# Patient Record
Sex: Female | Born: 1945 | Race: White | Hispanic: No | Marital: Married | Smoking: Former smoker
Health system: Southern US, Community
[De-identification: ages and names within clinical notes are randomized; demographics above are authoritative.]

## PROBLEM LIST (undated history)

## (undated) DIAGNOSIS — T7840XA Allergy, unspecified, initial encounter: Secondary | ICD-10-CM

## (undated) DIAGNOSIS — I639 Cerebral infarction, unspecified: Secondary | ICD-10-CM

## (undated) DIAGNOSIS — I10 Essential (primary) hypertension: Secondary | ICD-10-CM

## (undated) DIAGNOSIS — F419 Anxiety disorder, unspecified: Secondary | ICD-10-CM

## (undated) DIAGNOSIS — M069 Rheumatoid arthritis, unspecified: Secondary | ICD-10-CM

## (undated) HISTORY — PX: TUBAL LIGATION: SHX77

## (undated) HISTORY — PX: BLADDER SURGERY: SHX569

## (undated) HISTORY — DX: Anxiety disorder, unspecified: F41.9

## (undated) HISTORY — PX: APPENDECTOMY: SHX54

## (undated) HISTORY — PX: CHOLECYSTECTOMY: SHX55

## (undated) HISTORY — DX: Cerebral infarction, unspecified: I63.9

## (undated) HISTORY — PX: VAGINAL HYSTERECTOMY: SUR661

## (undated) HISTORY — DX: Essential (primary) hypertension: I10

## (undated) HISTORY — PX: EYE SURGERY: SHX253

## (undated) HISTORY — DX: Allergy, unspecified, initial encounter: T78.40XA

## (undated) HISTORY — DX: Rheumatoid arthritis, unspecified: M06.9

---

## 1998-06-05 ENCOUNTER — Other Ambulatory Visit: Admission: RE | Admit: 1998-06-05 | Discharge: 1998-06-05 | Payer: Self-pay | Admitting: Obstetrics and Gynecology

## 1998-07-16 ENCOUNTER — Ambulatory Visit (HOSPITAL_COMMUNITY): Admission: RE | Admit: 1998-07-16 | Discharge: 1998-07-16 | Payer: Self-pay | Admitting: Obstetrics and Gynecology

## 1999-03-13 ENCOUNTER — Emergency Department (HOSPITAL_COMMUNITY): Admission: EM | Admit: 1999-03-13 | Discharge: 1999-03-13 | Payer: Self-pay | Admitting: Emergency Medicine

## 1999-04-15 DIAGNOSIS — K449 Diaphragmatic hernia without obstruction or gangrene: Secondary | ICD-10-CM | POA: Insufficient documentation

## 1999-04-29 ENCOUNTER — Encounter: Payer: Self-pay | Admitting: Gastroenterology

## 1999-06-14 ENCOUNTER — Other Ambulatory Visit: Admission: RE | Admit: 1999-06-14 | Discharge: 1999-06-14 | Payer: Self-pay | Admitting: Obstetrics and Gynecology

## 1999-08-12 ENCOUNTER — Encounter: Payer: Self-pay | Admitting: Obstetrics and Gynecology

## 1999-08-12 ENCOUNTER — Ambulatory Visit (HOSPITAL_COMMUNITY): Admission: RE | Admit: 1999-08-12 | Discharge: 1999-08-12 | Payer: Self-pay | Admitting: Obstetrics and Gynecology

## 2000-06-29 ENCOUNTER — Other Ambulatory Visit: Admission: RE | Admit: 2000-06-29 | Discharge: 2000-06-29 | Payer: Self-pay | Admitting: Obstetrics and Gynecology

## 2001-03-26 ENCOUNTER — Encounter: Payer: Self-pay | Admitting: Obstetrics and Gynecology

## 2001-03-26 ENCOUNTER — Ambulatory Visit (HOSPITAL_COMMUNITY): Admission: RE | Admit: 2001-03-26 | Discharge: 2001-03-26 | Payer: Self-pay | Admitting: Obstetrics and Gynecology

## 2001-08-23 ENCOUNTER — Other Ambulatory Visit: Admission: RE | Admit: 2001-08-23 | Discharge: 2001-08-23 | Payer: Self-pay | Admitting: Obstetrics and Gynecology

## 2002-11-16 ENCOUNTER — Encounter: Payer: Self-pay | Admitting: Obstetrics and Gynecology

## 2002-11-16 ENCOUNTER — Ambulatory Visit (HOSPITAL_COMMUNITY): Admission: RE | Admit: 2002-11-16 | Discharge: 2002-11-16 | Payer: Self-pay | Admitting: Obstetrics and Gynecology

## 2004-05-28 ENCOUNTER — Ambulatory Visit (HOSPITAL_COMMUNITY): Admission: RE | Admit: 2004-05-28 | Discharge: 2004-05-28 | Payer: Self-pay | Admitting: Obstetrics and Gynecology

## 2005-01-24 ENCOUNTER — Ambulatory Visit (HOSPITAL_COMMUNITY): Admission: RE | Admit: 2005-01-24 | Discharge: 2005-01-24 | Payer: Self-pay | Admitting: Chiropractic Medicine

## 2005-07-07 ENCOUNTER — Ambulatory Visit (HOSPITAL_COMMUNITY): Admission: RE | Admit: 2005-07-07 | Discharge: 2005-07-07 | Payer: Self-pay | Admitting: Obstetrics and Gynecology

## 2006-05-27 ENCOUNTER — Ambulatory Visit: Payer: Self-pay | Admitting: Gastroenterology

## 2006-08-13 ENCOUNTER — Ambulatory Visit (HOSPITAL_COMMUNITY): Admission: RE | Admit: 2006-08-13 | Discharge: 2006-08-13 | Payer: Self-pay | Admitting: Family Medicine

## 2007-07-01 ENCOUNTER — Other Ambulatory Visit: Admission: RE | Admit: 2007-07-01 | Discharge: 2007-07-01 | Payer: Self-pay | Admitting: Gynecology

## 2007-07-02 ENCOUNTER — Ambulatory Visit: Payer: Self-pay | Admitting: Gastroenterology

## 2007-07-15 ENCOUNTER — Ambulatory Visit: Payer: Self-pay | Admitting: Gastroenterology

## 2007-08-13 ENCOUNTER — Ambulatory Visit: Payer: Self-pay | Admitting: Gastroenterology

## 2007-08-18 ENCOUNTER — Ambulatory Visit: Payer: Self-pay | Admitting: Gastroenterology

## 2007-10-15 ENCOUNTER — Ambulatory Visit (HOSPITAL_COMMUNITY): Admission: RE | Admit: 2007-10-15 | Discharge: 2007-10-15 | Payer: Self-pay | Admitting: Family Medicine

## 2008-03-29 DIAGNOSIS — K5909 Other constipation: Secondary | ICD-10-CM

## 2008-03-29 DIAGNOSIS — A048 Other specified bacterial intestinal infections: Secondary | ICD-10-CM | POA: Insufficient documentation

## 2008-03-29 DIAGNOSIS — R609 Edema, unspecified: Secondary | ICD-10-CM

## 2008-03-29 DIAGNOSIS — I10 Essential (primary) hypertension: Secondary | ICD-10-CM | POA: Insufficient documentation

## 2008-03-29 DIAGNOSIS — K589 Irritable bowel syndrome without diarrhea: Secondary | ICD-10-CM

## 2008-03-29 DIAGNOSIS — K219 Gastro-esophageal reflux disease without esophagitis: Secondary | ICD-10-CM

## 2008-03-29 DIAGNOSIS — E039 Hypothyroidism, unspecified: Secondary | ICD-10-CM | POA: Insufficient documentation

## 2008-05-15 ENCOUNTER — Emergency Department (HOSPITAL_COMMUNITY): Admission: EM | Admit: 2008-05-15 | Discharge: 2008-05-15 | Payer: Self-pay | Admitting: Emergency Medicine

## 2008-05-15 ENCOUNTER — Telehealth: Payer: Self-pay | Admitting: Gastroenterology

## 2008-09-26 ENCOUNTER — Ambulatory Visit: Payer: Self-pay | Admitting: Gastroenterology

## 2008-09-26 DIAGNOSIS — K3189 Other diseases of stomach and duodenum: Secondary | ICD-10-CM

## 2008-09-26 DIAGNOSIS — R05 Cough: Secondary | ICD-10-CM

## 2008-09-26 DIAGNOSIS — R1011 Right upper quadrant pain: Secondary | ICD-10-CM

## 2008-09-26 DIAGNOSIS — N3010 Interstitial cystitis (chronic) without hematuria: Secondary | ICD-10-CM | POA: Insufficient documentation

## 2008-09-26 DIAGNOSIS — R1013 Epigastric pain: Secondary | ICD-10-CM

## 2008-09-26 LAB — CONVERTED CEMR LAB
Bilirubin, Direct: 0.1 mg/dL (ref 0.0–0.3)
Total Bilirubin: 0.8 mg/dL (ref 0.3–1.2)
Total Protein: 7.3 g/dL (ref 6.0–8.3)

## 2008-10-05 ENCOUNTER — Ambulatory Visit (HOSPITAL_COMMUNITY): Admission: RE | Admit: 2008-10-05 | Discharge: 2008-10-05 | Payer: Self-pay | Admitting: Gastroenterology

## 2008-10-09 DIAGNOSIS — K8020 Calculus of gallbladder without cholecystitis without obstruction: Secondary | ICD-10-CM | POA: Insufficient documentation

## 2008-10-12 ENCOUNTER — Telehealth: Payer: Self-pay | Admitting: Gastroenterology

## 2008-10-17 ENCOUNTER — Telehealth: Payer: Self-pay | Admitting: Gastroenterology

## 2008-11-02 ENCOUNTER — Encounter: Payer: Self-pay | Admitting: Gastroenterology

## 2008-11-07 ENCOUNTER — Ambulatory Visit (HOSPITAL_COMMUNITY): Admission: RE | Admit: 2008-11-07 | Discharge: 2008-11-07 | Payer: Self-pay | Admitting: Surgery

## 2008-11-07 ENCOUNTER — Encounter (INDEPENDENT_AMBULATORY_CARE_PROVIDER_SITE_OTHER): Payer: Self-pay | Admitting: Surgery

## 2008-11-13 ENCOUNTER — Telehealth: Payer: Self-pay | Admitting: Gastroenterology

## 2008-12-05 ENCOUNTER — Encounter: Payer: Self-pay | Admitting: Gastroenterology

## 2008-12-18 ENCOUNTER — Telehealth: Payer: Self-pay | Admitting: Gastroenterology

## 2009-02-06 ENCOUNTER — Telehealth: Payer: Self-pay | Admitting: Gastroenterology

## 2009-04-12 ENCOUNTER — Telehealth: Payer: Self-pay | Admitting: Gastroenterology

## 2009-05-09 ENCOUNTER — Encounter: Payer: Self-pay | Admitting: Gastroenterology

## 2009-09-13 ENCOUNTER — Telehealth: Payer: Self-pay | Admitting: Gastroenterology

## 2009-10-12 ENCOUNTER — Encounter: Payer: Self-pay | Admitting: Gastroenterology

## 2010-01-02 ENCOUNTER — Telehealth (INDEPENDENT_AMBULATORY_CARE_PROVIDER_SITE_OTHER): Payer: Self-pay | Admitting: *Deleted

## 2010-07-17 ENCOUNTER — Telehealth: Payer: Self-pay | Admitting: Gastroenterology

## 2010-08-13 ENCOUNTER — Telehealth: Payer: Self-pay | Admitting: Gastroenterology

## 2010-09-20 ENCOUNTER — Ambulatory Visit (HOSPITAL_COMMUNITY): Admission: RE | Admit: 2010-09-20 | Discharge: 2010-09-20 | Payer: Self-pay | Admitting: Internal Medicine

## 2010-10-02 ENCOUNTER — Encounter: Admission: RE | Admit: 2010-10-02 | Discharge: 2010-10-02 | Payer: Self-pay | Admitting: Internal Medicine

## 2011-01-16 NOTE — Progress Notes (Signed)
Summary: Aciphex samples  Phone Note Call from Patient Call back at Home Phone [phone]   Caller: Patient Call For: Dr. Jarold Motto Reason for Call: Talk to Nurse Summary of Call: Wants to know if we have any samples of Aciphex Initial call taken by: Karna Christmas,  July 17, 2010 3:10 PM  Follow-up for Phone Call        Lm for pt to call.  Samples avaialble.   Lupita Leash Surface RN  July 17, 2010 3:17 PM  LM that samples are available and may be picked up at front desk. Follow-up by: Ashok Cordia RN,  July 18, 2010 8:59 AM    New/Updated Medications: ACIPHEX 20 MG  TBEC (RABEPRAZOLE SODIUM) Take 1 each day 30 minutes before meals

## 2011-01-16 NOTE — Progress Notes (Signed)
Summary: Aciphex samples  Phone Note Call from Patient Call back at Home Phone [phone]   Call For: Dr Jarold Motto Summary of Call: Wonders if she can have more aciphex Initial call taken by: Leanor Kail [facility],  August 13, 2010 10:11 AM  Follow-up for Phone Call        Samples at front desk.  Lm for pt that they can be picked up any time. Follow-up by: Ashok Cordia RN,  August 14, 2010 9:47 AM    New/Updated Medications: ACIPHEX 20 MG  TBEC (RABEPRAZOLE SODIUM) Take 1 each day 30 minutes before meals

## 2011-01-16 NOTE — Progress Notes (Signed)
Summary: Record request   Request for records received from [facility]. Request forwarded to [facility]. Nichole Cruz  January 02, 2010 1:58 PM

## 2011-04-29 NOTE — Op Note (Signed)
NAMESHONTERIA, ABELN NO.:  [id number]   MEDICAL RECORD NO.:  [id number]          PATIENT TYPE:  AMB   LOCATION:  SDS                          FACILITY:  [facility]   PHYSICIAN:  Thomas A. Cornett, M.D.DATE OF BIRTH:  [birth year]   DATE OF PROCEDURE:  11/07/2008  DATE OF DISCHARGE:  11/07/2008                               OPERATIVE REPORT   PREOPERATIVE DIAGNOSIS:  Biliary dyskinesia.   POSTOPERATIVE DIAGNOSIS:  Biliary dyskinesia.   PROCEDURE:  Laparoscopic cholecystectomy with intraoperative  cholangiogram using fluoroscopy.   SURGEON:  Maisie Fus A. Cornett, MD   ASSISTANT:  Ollen Gross. Vernell Morgans, MD   ANESTHESIA:  General endotracheal anesthesia, 0.25% Sensorcaine local.   ESTIMATED BLOOD LOSS:  20 mL.   SPECIMEN:  Gallbladder to pathology.   INDICATIONS FOR PROCEDURE:  The patient is a 65 year old female with  abdominal pain and a low ejection fraction on HIDA study.  She presents  for laparoscopic cholecystectomy for that.  We discussed the potential  outcomes given the ejection fraction of 44% which she had.  There was  roughly a 50:50 chance of this helping her which I discussed in our  office.  Given her amount of pain, she did want to proceed with this  after I discussed the options of surgery versus nonoperative management  of this.   DESCRIPTION OF PROCEDURE:  The patient was brought to the operating room  and placed supine.  After induction of general anesthesia, the abdomen  was prepped and draped in sterile fashion.  A 1-cm supraumbilical  incision was made.  Dissection was carried down to the fascia.  There  was a small umbilical hernia and I entered that and into the abdominal  cavity through that very easily.  Pursestring suture was placed around  this.  A 12-mm Hasson cannula was placed under direct vision through  this and pneumoperitoneum was created to 15 mmHg of CO2 with  laparoscope.  The patient was placed in reverse Trendelenburg and  rolled  towards her left.  Laparoscopy revealed no significant major intra-  abdominal abnormality.  Gallbladder did have some scarring to it  consistent with chronic cholecystitis.  Next, an 11-mm subxiphoid port  was placed and two 5-mm ports were placed in the right upper quadrant  under direct vision.  Gallbladder was grabbed by its dome and pushed  towards the patient's right shoulder.  A second grasper was used to grab  the gallbladder by the infundibulum and pulled to the patient's right  lower quadrant.  We used the dissection to dissect away the fat  overlying the gallbladder and identified the cystic duct as the only  tubular structure entering the gallbladder.  Clip was placed on  gallbladder side of this.  A small incision was made in the cystic duct  and through a separate stab incision, a Cook cholangiogram catheter was  introduced and placed in the cystic duct.  Intraoperative cholangiogram  was then performed which showed free flow of contrast down the cystic  duct into the common bile duct.  Free flow of contrast from the common  hepatic duct into right and left hepatic ductules.  No sign of  stricture, stone, or extravasation.  Catheter was then removed.  The  cystic duct stump was triple clipped and divided.  Cystic artery was  also divided between clips.  The individual branches that came off the  main trunk were taken on the gallbladder body and divided.  Cautery was  then used to dissect the gallbladder from the gallbladder fossa without  difficulty.  Gallbladder was placed in an EndoCatch bag.  Gallbladder  bed was examined, found to be hemostatic without bleeding or leakage of  bile.  At this point in time, we extracted the gallbladder through the  umbilical port by placing the camera in the subxiphoid port.  We then  flattened the patient out, suctioned out any excess irrigation, found  the gallbladder bed to be hemostatic without any leakage of bile.  No   evidence of injury to the small bowel, colon, or other intra-abdominal  organs upon inspection at this point.  We then allowed the CO2 to escape  after closing the umbilical port site with a pursestring suture of 0  Vicryl once we removed the gallbladder.  Once CO2 was allowed to escape,  we closed all skin incisions with 4-0 Monocryl.  Dermabond was applied.  All final counts of sponge, needle, and instruments were found to be  correct at this portion of the case.  The patient was then awoke and  taken to recovery in satisfactory condition.      Thomas A. Cornett, M.D.  Electronically Signed     TAC/MEDQ  D:  11/07/2008  T:  11/08/2008  Job:  [id number]   cc:   Vania Rea. Jarold Motto, MD, Clementeen Graham, FACP, FAGA  Aida Puffer

## 2011-04-29 NOTE — Assessment & Plan Note (Signed)
[facility]                         GASTROENTEROLOGY OFFICE NOTE   NAME:HERNDONLavaughn, Bisig                  MRN:          [id number]  DATE:07/02/2007                            DOB:          June 01, 1946    Yuriko continues with some spasmodic type pain in the subxiphoid area,  especially under periods of stress.  She has had known acid reflux but  has responded well to omeprazole 20 mg a day.  She denies hepatobiliary  complaints but has not had an ultrasound exam in close to 10 years.  Last endoscopy performed in May 2000, did show a 5-cm hiatal hernia.  Biopsy at that time for Helicobacter was negative.  She did have  esophageal stricture that was dilated at that time.   She apparently has been treated twice the last 2 years for H. pylori  infection.  I suspect she has a chronically positive serologic antibody  but does not have active H. pylori disease.  In any case, antibiotic  therapy has not changed any of her symptomatology.  It  described as an  intermittent dull aching discomfort in her epigastric area, best  relieved by using Librax.  She has had a thorough GI workup in the past  and has a long history of irritable bowel syndrome for at least the last  20 years.   The patient does have a family history of colon carcinoma and actually  is due for colonoscopy followup.   PHYSICAL EXAMINATION:  VITAL SIGNS:  Show her weight is 202 pounds,  blood pressure 128/82, pulse 80 and regular.  I could not appreciate  stigmata of chronic liver disease.  CHEST:  Clear  HEART:  She was in a regular rhythm without murmurs, gallops, or rubs.  ABDOMEN:  I could not appreciate hepatosplenomegaly, abdominal masses,  or tenderness.  Bowel sounds were normal.   ASSESSMENT:  1. 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  2. Rule out cholelithiasis.  3. Need for followup colonoscopy for a positive family history of      colon  cancer.  4. Chronic irritable bowel syndrome.   RECOMMENDATIONS:  1. Outpatient ultrasound exam.  2. I will change her from omeprazole to 40 mg of Nexium daily.  See if      this helps her complaints.  3. P.r.n. Librax use.  4. Schedule outpatient colonoscopy followup.     Vania Rea. Jarold Motto, MD, Caleen Essex, FAGA  Electronically Signed    DRP/MedQ  DD: 07/02/2007  DT: 07/02/2007  Job #: [id number]   cc:   Chari Manning, Dr.

## 2011-09-01 ENCOUNTER — Other Ambulatory Visit (HOSPITAL_COMMUNITY): Payer: Self-pay | Admitting: Internal Medicine

## 2011-09-01 DIAGNOSIS — Z1231 Encounter for screening mammogram for malignant neoplasm of breast: Secondary | ICD-10-CM

## 2011-09-11 LAB — BASIC METABOLIC PANEL
CO2: 24
Chloride: 107
GFR calc Af Amer: >60
Potassium: 3.9
Sodium: 140

## 2011-09-11 LAB — CBC
HCT: 36.3
Hemoglobin: 12.2
MCHC: 33.5
MCV: 86.8
RBC: 4.18

## 2011-09-11 LAB — DIFFERENTIAL
Basophils Relative: 0
Eosinophils Absolute: 0.2
Eosinophils Relative: 2
Monocytes Absolute: 0.7
Monocytes Relative: 8
Neutro Abs: 4.4

## 2011-09-11 LAB — POCT CARDIAC MARKERS: CKMB, poc: <1 — ABNORMAL LOW

## 2011-09-16 LAB — DIFFERENTIAL
Basophils Absolute: 0.1
Eosinophils Absolute: 0.4
Monocytes Absolute: 0.4
Neutrophils Relative %: 57

## 2011-09-16 LAB — COMPREHENSIVE METABOLIC PANEL
ALT: 17
Alkaline Phosphatase: 38 — ABNORMAL LOW
CO2: 24
Calcium: 9
GFR calc non Af Amer: >60
Glucose, Bld: 136 — ABNORMAL HIGH
Potassium: 4
Sodium: 137

## 2011-09-16 LAB — CBC
Hemoglobin: 12.4
MCHC: 33.9
RBC: 4.25

## 2011-09-23 ENCOUNTER — Ambulatory Visit (HOSPITAL_COMMUNITY): Payer: Self-pay

## 2011-10-01 ENCOUNTER — Ambulatory Visit (HOSPITAL_COMMUNITY)
Admission: RE | Admit: 2011-10-01 | Discharge: 2011-10-01 | Disposition: A | Discharge status: Home / Self Care | Payer: Medicare Other | Source: Ambulatory Visit | Attending: Internal Medicine | Admitting: Internal Medicine

## 2011-10-01 DIAGNOSIS — Z1231 Encounter for screening mammogram for malignant neoplasm of breast: Secondary | ICD-10-CM | POA: Insufficient documentation

## 2012-02-13 ENCOUNTER — Other Ambulatory Visit: Payer: Self-pay | Admitting: Internal Medicine

## 2012-02-13 DIAGNOSIS — M5412 Radiculopathy, cervical region: Secondary | ICD-10-CM

## 2012-02-18 ENCOUNTER — Ambulatory Visit
Admission: RE | Admit: 2012-02-18 | Discharge: 2012-02-18 | Disposition: A | Discharge status: Home / Self Care | Payer: Medicare Other | Source: Ambulatory Visit | Attending: Internal Medicine | Admitting: Internal Medicine

## 2012-02-18 DIAGNOSIS — M5412 Radiculopathy, cervical region: Secondary | ICD-10-CM

## 2012-10-07 ENCOUNTER — Other Ambulatory Visit (HOSPITAL_COMMUNITY): Payer: Self-pay | Admitting: Internal Medicine

## 2012-10-07 DIAGNOSIS — Z1231 Encounter for screening mammogram for malignant neoplasm of breast: Secondary | ICD-10-CM

## 2012-10-25 ENCOUNTER — Ambulatory Visit (HOSPITAL_COMMUNITY)
Admission: RE | Admit: 2012-10-25 | Discharge: 2012-10-25 | Disposition: A | Discharge status: Home / Self Care | Payer: Medicare Other | Source: Ambulatory Visit | Attending: Internal Medicine | Admitting: Internal Medicine

## 2012-10-25 DIAGNOSIS — Z1231 Encounter for screening mammogram for malignant neoplasm of breast: Secondary | ICD-10-CM

## 2013-09-28 ENCOUNTER — Other Ambulatory Visit (HOSPITAL_COMMUNITY): Payer: Self-pay | Admitting: Internal Medicine

## 2013-09-28 DIAGNOSIS — Z1231 Encounter for screening mammogram for malignant neoplasm of breast: Secondary | ICD-10-CM

## 2013-11-01 ENCOUNTER — Other Ambulatory Visit (HOSPITAL_COMMUNITY): Payer: Self-pay | Admitting: Internal Medicine

## 2013-11-01 ENCOUNTER — Ambulatory Visit (HOSPITAL_COMMUNITY)
Admission: RE | Admit: 2013-11-01 | Discharge: 2013-11-01 | Disposition: A | Discharge status: Home / Self Care | Payer: Medicare Other | Source: Ambulatory Visit | Attending: Internal Medicine | Admitting: Internal Medicine

## 2013-11-01 DIAGNOSIS — Z1231 Encounter for screening mammogram for malignant neoplasm of breast: Secondary | ICD-10-CM | POA: Insufficient documentation

## 2014-10-23 ENCOUNTER — Other Ambulatory Visit: Payer: Self-pay | Admitting: Internal Medicine

## 2014-10-23 DIAGNOSIS — Z139 Encounter for screening, unspecified: Secondary | ICD-10-CM

## 2014-11-01 ENCOUNTER — Ambulatory Visit: Payer: Medicare Other

## 2014-11-02 ENCOUNTER — Ambulatory Visit: Payer: Medicare Other

## 2015-03-27 ENCOUNTER — Ambulatory Visit (INDEPENDENT_AMBULATORY_CARE_PROVIDER_SITE_OTHER): Payer: Medicare Other | Admitting: Physical Therapy

## 2015-03-27 ENCOUNTER — Encounter: Payer: Self-pay | Admitting: Physical Therapy

## 2015-03-27 DIAGNOSIS — M542 Cervicalgia: Secondary | ICD-10-CM

## 2015-03-27 DIAGNOSIS — R293 Abnormal posture: Secondary | ICD-10-CM | POA: Diagnosis not present

## 2015-03-27 DIAGNOSIS — G8929 Other chronic pain: Secondary | ICD-10-CM

## 2015-03-27 NOTE — Patient Instructions (Signed)
  Flexibility: Upper Trapezius Stretch   Gently grasp right side of head while reaching behind back with other hand. Tilt head away until a gentle stretch is felt. Hold _30___ seconds. Repeat 3___ times per set. Do ____ sets per session. Do _2___ sessions per day.  http://orth.exer.us/340   Levator Stretch   Grasp seat or sit on hand on side to be stretched. Turn head toward other side and look down. Use hand on head to gently stretch neck in that position. Hold _30___ seconds. Repeat on other side. Repeat _3___ times. Do 2____ sessions per day.  http://gt2.exer.us/30   Scapular Retraction (Standing) and SHOULDER ROLLS backwards   With arms at sides, pinch shoulder blades together. Repeat _10___ times per set. Do ____ sets per session. Do __2__ sessions per day.  http://orth.exer.us/944    Posture - Sitting   Sit upright, head facing forward. Try using a roll to support lower back. Keep shoulders relaxed, and avoid rounded back. Keep hips level with knees. Avoid crossing legs for long periods.   Flexibility: Corner Stretch   Standing in corner or in the doorway with hands just above shoulder level lean forward until a comfortable stretch is felt across chest. Hold _30___ seconds. Repeat __3__ times per set. Do ____ sets per session. Do __2__ sessions per day.  http://orth.exer.us/342   Copyright  VHI. All rights reserved.  Solon PalmJulie Torre [place]umb[place], PT 03/27/2015 9:19 AM  [facility] [phone]

## 2015-03-27 NOTE — Therapy (Addendum)
[place], [place], [address] Phone: [phone]   Fax:  [phone]  Physical Therapy Evaluation  Patient Details  Name: Nichole Cruz MRN: [id number] Date of Birth: Aug 25, 1946 Referring Provider:  Melina Schools, MD  Encounter Date: 03/27/2015      PT End of Session - 03/27/15 0931    Visit Number 1   Number of Visits 8   Date for PT Re-Evaluation 04/27/15   PT Start Time 7035   PT Stop Time 0932   PT Time Calculation (min) 45 min   Activity Tolerance Patient tolerated treatment well   Behavior During Therapy [facility] for tasks assessed/performed      Past Medical History  Diagnosis Date  . Stroke     march 2015  . Allergy   . Hypertension   . Anxiety     Past Surgical History  Procedure Laterality Date  . Appendectomy    . Cesarean section    . Eye surgery    . Abdominal hysterectomy      There were no vitals filed for this visit.  Visit Diagnosis:  Posture abnormality - Plan: PT plan of care cert/re-cert  Chronic neck pain - Plan: PT plan of care cert/re-cert      Subjective Assessment - 03/27/15 0857    Subjective Patient has been experiencing this neck pain for 2-3 years and only comes on with stress. Patient cares for her husband with late stage dimentia. Doesnt hurt to pick 18 year old granddaughter.   Diagnostic tests xrays show some arthritis   Patient Stated Goals learn how to handle the neck pain   Currently in Pain? Yes   Pain Score 3    Pain Location Neck   Pain Orientation Left   Pain Descriptors / Indicators Tingling   Pain Type Acute pain   Pain Radiating Towards left shoulder to neck   Pain Onset More than a month ago   Pain Frequency Intermittent   Aggravating Factors  stress   Pain Relieving Factors leaving the situation            [facility] PT Assessment - 03/27/15 0001    Assessment   Medical Diagnosis cervical pain   Onset Date 02/12/14   Next MD  Visit no appt   Prior Therapy none   Precautions   Precautions None   Balance Screen   Has the patient fallen in the past 6 months No   Has the patient had a decrease in activity level because of a fear of falling?  No   Is the patient reluctant to leave their home because of a fear of falling?  No   Prior Function   Level of Independence Independent with basic ADLs   Observation/Other Assessments   Focus on Therapeutic Outcomes (FOTO)  18%   Posture/Postural Control   Postural Limitations Rounded Shoulders;Forward head;Increased thoracic kyphosis   Posture Comments Pt needed cues to keep cerv spine neutral during eval   Tone   Assessment Location Other (comment)   Tone Assessment - Other   Other Tone Location neck   Other Tone Location Comments increased tone in bil upper traps left > rt   ROM / Strength   AROM / PROM / Strength AROM;Strength   AROM   Overall AROM  --  cervical WNL; mild rt lat flex deficits due to  UT tightness   Strength   Overall Strength --  5/5 cervical strength no pain  Palpation   Palpation unremarkable                   [facility] Adult PT Treatment/Exercise - 03/27/15 0001    Posture/Postural Control   Posture/Postural Control Postural limitations  Education on correct sititng posture.   Exercises   Exercises Neck;Shoulder   Neck Exercises: Theraband   Scapula Retraction 10 reps  no band   Neck Exercises: Seated   Shoulder Rolls Backwards;5 reps   Modalities   Modalities Ultrasound   Ultrasound   Ultrasound Location left upper traps   Ultrasound Parameters 1.5 wcm2 1 mz cont x 8 min   Ultrasound Goals Other (Comment)  tone   Neck Exercises: Stretches   Upper Trapezius Stretch 2 reps;30 seconds  bil   Levator Stretch 2 reps;30 seconds   Corner Stretch 1 rep;30 seconds;Other (comment)  demonstrated and pt returned demo of doorway stretch as alt                PT Education - 03/27/15 0946    Education provided Yes    Education Details HEP   Person(s) Educated Patient   Methods Explanation;Demonstration;Handout   Comprehension Verbalized understanding;Returned demonstration;Tactile cues required          PT Short Term Goals - 03/27/15 0953    PT SHORT TERM GOAL #1   Title I with inital HEP   Time 2   Period Weeks   Status New           PT Long Term Goals - 03/27/15 0953    PT LONG TERM GOAL #1   Title I with advanced HEP   Time 4   Period Weeks   Status New   PT LONG TERM GOAL #2   Title report decreased incidence of neck pain by 60% or more.   Time 4   Period Weeks   Status New   PT LONG TERM GOAL #3   Title decreased pain in neck to 1/10 or less with stress   Time 4   Period Weeks   Status New   PT LONG TERM GOAL #4   Title no increase in FOTO limitation score   Time 4   Period Weeks   Status New               Plan - 03/27/15 0947    Clinical Impression Statement Patient presents with bil neck pain left > right that only presents when patient is under stress. Patient cares for her husband with dementia and feels relief whenever she is able to leave him for a while. Patient was dx with a stroke last year on left side, but no residual  effects. Patient demos postural deficits including forward head and rounded shoulders.   Pt will benefit from skilled therapeutic intervention in order to improve on the following deficits Impaired flexibility;Postural dysfunction;Pain;Decreased strength   Rehab Potential Good   PT Frequency 2x / week   PT Duration 4 weeks   PT Treatment/Interventions ADLs/Self Care Home Management;Moist Heat;Patient/family education;Therapeutic exercise;Ultrasound;Manual techniques;Cryotherapy;Neuromuscular re-education;Electrical Stimulation   PT Next Visit Plan review HEP, Meeks decompresion exercises, scapular strengthening, modalities prn   Consulted and Agree with Plan of Care Patient         Problem List Patient Active Problem List    Diagnosis Date Noted  . CHOLELITHIASIS 10/09/2008  . DYSPEPSIA 09/26/2008  . 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 COUGH 09/26/2008  . RUQ PAIN 09/26/2008  . ABDOMINAL PAIN-EPIGASTRIC 09/26/2008  . HELICOBACTER  PYLORI GASTRITIS 03/29/2008  . HYPOTHYROIDISM 03/29/2008  . HYPERTENSION 03/29/2008  . GERD 03/29/2008  . CONSTIPATION, CHRONIC 03/29/2008  . IRRITABLE BOWEL SYNDROME 03/29/2008  . PERIPHERAL EDEMA 03/29/2008  . HIATAL HERNIA 04/15/1999    Madelyn Flavors PT  03/27/2015, 10:08 AM  [facility] [place], [place], [address] Phone: [phone]   Fax:  [phone]    PHYSICAL THERAPY DISCHARGE SUMMARY  Visits from Start of Care: 1  Current functional level related to goals / functional outcomes: unknown   Remaining deficits: unknown   Education / Equipment: Initial HEP Plan:                                                    Patient goals were not met. Patient is being discharged due to not returning since the last visit.  ?????    Jeral Pinch, PT

## 2015-03-29 ENCOUNTER — Encounter: Payer: Medicare Other | Admitting: Physical Therapy

## 2015-04-02 ENCOUNTER — Encounter: Payer: Medicare Other | Admitting: Physical Therapy

## 2015-04-04 ENCOUNTER — Encounter: Payer: Medicare Other | Admitting: Physical Therapy

## 2015-04-09 ENCOUNTER — Encounter: Payer: Medicare Other | Admitting: Physical Therapy

## 2015-04-11 ENCOUNTER — Encounter: Payer: Medicare Other | Admitting: Physical Therapy

## 2015-04-16 ENCOUNTER — Encounter: Payer: Medicare Other | Admitting: Physical Therapy

## 2015-04-18 ENCOUNTER — Encounter: Payer: Medicare Other | Admitting: Physical Therapy

## 2015-08-30 ENCOUNTER — Other Ambulatory Visit: Payer: Self-pay | Admitting: Internal Medicine

## 2015-08-30 DIAGNOSIS — Z139 Encounter for screening, unspecified: Secondary | ICD-10-CM

## 2015-09-05 ENCOUNTER — Ambulatory Visit: Payer: Medicare Other

## 2015-11-07 ENCOUNTER — Ambulatory Visit: Payer: Medicare Other

## 2015-11-14 ENCOUNTER — Ambulatory Visit: Payer: Medicare Other

## 2015-11-14 DIAGNOSIS — Z139 Encounter for screening, unspecified: Secondary | ICD-10-CM

## 2016-09-26 ENCOUNTER — Ambulatory Visit (INDEPENDENT_AMBULATORY_CARE_PROVIDER_SITE_OTHER): Payer: Medicare Other | Admitting: Internal Medicine

## 2016-09-26 ENCOUNTER — Encounter: Payer: Self-pay | Admitting: Internal Medicine

## 2016-09-26 VITALS — BP 158/98 | HR 62 | Ht 66.0 in | Wt 195.6 lb

## 2016-09-26 DIAGNOSIS — R0609 Other forms of dyspnea: Secondary | ICD-10-CM

## 2016-09-26 DIAGNOSIS — R05 Cough: Secondary | ICD-10-CM | POA: Diagnosis not present

## 2016-09-26 DIAGNOSIS — Z8739 Personal history of other diseases of the musculoskeletal system and connective tissue: Secondary | ICD-10-CM

## 2016-09-26 DIAGNOSIS — J387 Other diseases of larynx: Secondary | ICD-10-CM

## 2016-09-26 DIAGNOSIS — R053 Chronic cough: Secondary | ICD-10-CM

## 2016-09-26 LAB — NITRIC OXIDE: NITRIC OXIDE: 13

## 2016-09-26 MED ORDER — FLUTICASONE PROPIONATE 50 MCG/ACT NA SUSP: 2.0000 | Freq: Every day | NASAL | 2 refills | Status: DC

## 2016-09-26 MED ORDER — GABAPENTIN 300 MG PO CAPS
ORAL_CAPSULE | ORAL | 0 refills | Status: DC
Start: 2016-09-26 — End: 2016-10-27

## 2016-09-26 NOTE — Progress Notes (Signed)
Subjective:    Patient ID: Nichole Cruz, female    DOB: February 23, 1946, 70 y.o.   MRN: [id number]  PCP Pearla Dubonnet, MD   HPI  IOV 09/26/2016  Chief Complaint  Patient presents with  . Pulmonary Consult    Self referral for dry chronic cough x several years. Pt c/o SOB only when coughing, fatigue, and night sweats x 6 months. Pt deneis CP/tightness and f/c/s.   70 year old female self-referred for chronic cough. Her brother Ina Kick used to be taken care of by myself and apparently he recommended that I see her for chronic cough  According to the patient she said chronic cough for the last 10 or 15 years. She used to see Dr. Alroy Dust in our office . Apparently no etiology for the cough has been found. The cough is a moderate severity. It is been progressive in the last few years. Associated with the cough in the last few years has been insidious onset of shortness of breath that is present on walking 1 block and relieved by exertion. The quality of her cough is dry but recently in the last few years occasionally she brings up sputum. Cough is present mainly in the daytime but in the last few years also present occasionally at night. It does wake her up. This no associated wheezing. Cough is made worse by swallowing but she says a few months ago a gastroenterologist ruled out dysphagia. Several months ago she also had allergy shots with an allergist at [facility] but these did not help. She's not tried inhalers or prednisone for the same in the recent few years. Talking does make her cough worse. Having lozenges or peppermint staying quite drinking water can make her cough better. She does clear her throat a lot. She does feel a tickle in the back of her throat. There is no gag as such. RSI cough score is 26 and suggestive of irritable larynx syndrome.  Cough associated history - Sinus: She does have mild sinus drainage. Allergy shots did not help cough - Acid reflux history: She  feels this is well controlled on PPI - Pulmonary history: Denies asthma pulmonary fibrosis or any pulmonary issues. Remote limited smoker - Other history: She does have rheumatoid arthritis not otherwise specified. She believes this is mild. She is not on any medication. She does not see her rheumatologist for the same.   Dr Gretta Cool Reflux Symptom Index (> 13-15 suggestive of LPR cough) 09/26/2016   Hoarseness of problem with voice 3  Clearing  Of Throat 0  Excess throat mucus or feeling of post nasal drip 2  Difficulty swallowing food, liquid or tablets 5  Cough after eating or lying down 5  Breathing difficulties or choking episodes 2  Troublesome or annoying cough 5  Sensation of something sticking in throat or lump in throat 4  Heartburn, chest pain, indigestion, or stomach acid coming up 0  TOTAL 26      cxr 2009 - clear ; personally visualized  feno 09/26/2016 - 13ppb and normal     has a past medical history of Allergy; Anxiety; Hypertension; RA (rheumatoid arthritis) (HCC); and Stroke (HCC).   reports that she quit smoking about 49 years ago. Her smoking use included Cigarettes. She started smoking about 54 years ago. She has a 3.75 pack-year smoking history. She has never used smokeless tobacco.  Past Surgical History:  Procedure Laterality Date  . APPENDECTOMY    . BLADDER SURGERY    .  CESAREAN SECTION    . CHOLECYSTECTOMY    . EYE SURGERY    . TUBAL LIGATION    . VAGINAL HYSTERECTOMY      Allergies  Allergen Reactions  . Codeine     REACTION: Nausea, vomiting    Immunization History  Administered Date(s) Administered  . Pneumococcal-Unspecified 12/15/2012    Family History  Problem Relation Age of Onset  . Heart disease Father   . Heart disease Sister   . Emphysema Brother   . Rheum arthritis Brother   . Heart disease Brother      Current Outpatient Prescriptions:  .  cholecalciferol (VITAMIN D) 1000 units tablet, Take 2,000 Units by mouth 2  (two) times daily., Disp: , Rfl:  .  citalopram (CELEXA) 10 MG tablet, Take 10 mg by mouth 2 (two) times daily as needed., Disp: , Rfl:  .  fluticasone (FLONASE) 50 MCG/ACT nasal spray, Place 2 sprays into both nostrils daily., Disp: , Rfl:  .  labetalol (NORMODYNE) 100 MG tablet, Take 100 mg by mouth 2 (two) times daily., Disp: , Rfl:  .  omeprazole (PRILOSEC) 20 MG capsule, Take 20 mg by mouth daily., Disp: , Rfl:    Review of Systems  Constitutional: Positive for fatigue. Negative for fever and unexpected weight change.  HENT: Negative for congestion, dental problem, ear pain, nosebleeds, postnasal drip, rhinorrhea, sinus pressure, sneezing, sore throat and trouble swallowing.   Eyes: Negative for redness and itching.  Respiratory: Positive for cough. Negative for chest tightness, shortness of breath and wheezing.   Cardiovascular: Negative for palpitations and leg swelling.  Gastrointestinal: Negative for nausea and vomiting.  Genitourinary: Negative for dysuria.  Musculoskeletal: Negative for joint swelling.  Skin: Negative for rash.  Neurological: Negative for headaches.  Hematological: Does not bruise/bleed easily.  Psychiatric/Behavioral: Negative for dysphoric mood. The patient is not nervous/anxious.        Objective:   Physical Exam  Constitutional: She is oriented to person, place, and time. She appears well-developed and well-nourished. No distress.  HENT:  Head: Normocephalic and atraumatic.  Right Ear: External ear normal.  Left Ear: External ear normal.  Mouth/Throat: Oropharynx is clear and moist. No oropharyngeal exudate.  Mild post nasal drip + Laryngeal quality of cough x 1 +  Eyes: Conjunctivae and EOM are normal. Pupils are equal, round, and reactive to light. Right eye exhibits no discharge. Left eye exhibits no discharge. No scleral icterus.  Neck: Normal range of motion. Neck supple. No JVD present. No tracheal deviation present. No thyromegaly present.    Cardiovascular: Normal rate, regular rhythm, normal heart sounds and intact distal pulses.  Exam reveals no gallop and no friction rub.   No murmur heard. Pulmonary/Chest: Effort normal and breath sounds normal. No respiratory distress. She has no wheezes. She has no rales. She exhibits no tenderness.  Abdominal: Soft. Bowel sounds are normal. She exhibits no distension and no mass. There is no tenderness. There is no rebound and no guarding.  Musculoskeletal: Normal range of motion. She exhibits no edema or tenderness.  Lymphadenopathy:    She has no cervical adenopathy.  Neurological: She is alert and oriented to person, place, and time. She has normal reflexes. No cranial nerve deficit. She exhibits normal muscle tone. Coordination normal.  Skin: Skin is warm and dry. No rash noted. She is not diaphoretic. No erythema. No pallor.  Psychiatric: She has a normal mood and affect. Her behavior is normal. Judgment and thought content normal.  Vitals reviewed.  Vitals:   09/26/16 0921  BP: (!) 158/98  Pulse: 62  SpO2: 93%  Weight: 195 lb 9.6 oz (88.7 kg)  Height: 5\' 6"  (1.676 m)         Assessment & Plan:     ICD-9-CM ICD-10-CM   1. Chronic cough 786.2 R05 Nitric oxide     CT Chest High Resolution     CT Maxillofacial LTD WO CM     Ambulatory Referral to Neuro Rehab  2. 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 Dyspnea on exertion 786.09 R06.09 Nitric oxide     CT Chest High Resolution     CT Maxillofacial LTD WO CM  4. History of rheumatoid arthritis V13.4 Z87.39 Nitric oxide     CT Chest High Resolution     CT Maxillofacial LTD WO CM     #Cough -Cough is from possible sinus drainage, possible acid reflux -No evidence of asthma - Unclear if rheumatoid is involving lung and causing symptoms - All of this is working together to cause cyclical cough/LPR cough or cough  neuropathy or irritable throat syndrome  #For possible Sinus drainage - - start nasal steroid generic fluticasone inhaler 2 squirts each nostril daily as advised  - get CT sinus   #For Possible Acid Reflux  -continue prilosec  - At all times avoid colas, spices, cheeses, spirits, red meats, beer, chocolates, fried foods etc.,   - sleep with head end of bed elevated  - eat small frequent meals  - do not go to bed for 3 hours after last meal  #Cyclical cough/Irritable Larynx  - please choose 2-3 days and observe complete voice rest - no talking or whispering  - at all times there  there is urge to cough, drink water or swallow or sip on throat lozenge - see Mr Verdie Mosher speech therapist - Take gabapentin 300mg  once daily x 5 days, then 300mg  twice daily x r days, then 300mg  three times daily to continue. If this makes you too sleepy or drowsy call us and we will cut your medication dosing down  #Shortness of breath  -await results of CT chest  #Followup - Myself or my APP will see you in 4-8  Weeks - AM appointment only - any problems call or come sooner

## 2016-09-26 NOTE — Patient Instructions (Addendum)
ICD-9-CM ICD-10-CM   1. Chronic cough 786.2 R05 Nitric oxide     CT Chest High Resolution     CT Maxillofacial LTD WO CM     Ambulatory Referral to Neuro Rehab  2. 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 Dyspnea on exertion 786.09 R06.09 Nitric oxide     CT Chest High Resolution     CT Maxillofacial LTD WO CM  4. History of rheumatoid arthritis V13.4 Z87.39 Nitric oxide     CT Chest High Resolution     CT Maxillofacial LTD WO CM    #Cough Cough is from possible sinus drainage, possible acid reflux No evidence of asthma Unclear if rheumatoid is involving lung and causing symptoms All of this is working together to cause cyclical cough/LPR cough or cough neuropathy or irritable throat syndrome  #For possible Sinus drainage - - start nasal steroid generic fluticasone inhaler 2 squirts each nostril daily as advised  - get CT sinus   #For Possible Acid Reflux  -continue prilosec  - At all times avoid colas, spices, cheeses, spirits, red meats, beer, chocolates, fried foods etc.,   - sleep with head end of bed elevated  - eat small frequent meals  - do not go to bed for 3 hours after last meal  #Cyclical cough/Irritable Larynx  - please choose 2-3 days and observe complete voice rest - no talking or whispering  - at all times there  there is urge to cough, drink water or swallow or sip on throat lozenge - see Mr Verdie MosherCarl Schinke speech therapist - Take gabapentin 300mg  once daily x 5 days, then 300mg  twice daily x r days, then 300mg  three times daily to continue. If this makes you too sleepy or drowsy call us and we will cut your medication dosing down  #Shortness of breath  -await results of CT chest  #Followup - Myself or my APP will see you in 4-8  Weeks - AM appointment only - any problems call or come sooner

## 2016-09-30 ENCOUNTER — Ambulatory Visit (INDEPENDENT_AMBULATORY_CARE_PROVIDER_SITE_OTHER)
Admission: RE | Admit: 2016-09-30 | Discharge: 2016-09-30 | Disposition: A | Discharge status: Home / Self Care | Payer: Medicare Other | Source: Ambulatory Visit | Attending: Internal Medicine | Admitting: Internal Medicine

## 2016-09-30 DIAGNOSIS — Z8739 Personal history of other diseases of the musculoskeletal system and connective tissue: Secondary | ICD-10-CM | POA: Diagnosis not present

## 2016-09-30 DIAGNOSIS — R05 Cough: Secondary | ICD-10-CM | POA: Diagnosis not present

## 2016-09-30 DIAGNOSIS — R0609 Other forms of dyspnea: Secondary | ICD-10-CM

## 2016-09-30 DIAGNOSIS — J387 Other diseases of larynx: Secondary | ICD-10-CM | POA: Diagnosis not present

## 2016-09-30 DIAGNOSIS — R053 Chronic cough: Secondary | ICD-10-CM

## 2016-10-02 ENCOUNTER — Telehealth: Payer: Self-pay | Admitting: Internal Medicine

## 2016-10-02 DIAGNOSIS — R05 Cough: Secondary | ICD-10-CM

## 2016-10-02 DIAGNOSIS — R053 Chronic cough: Secondary | ICD-10-CM

## 2016-10-02 NOTE — Telephone Encounter (Signed)
LM x 1 

## 2016-10-02 NOTE — Telephone Encounter (Signed)
  ctt sinus - ok  Ct chest - suggests mild ILD. Plan: do PFT before ROV in nov 2017  Thanks  ,Dr. Kalman ShanMurali Maryum Batterson, M.D., [facility].P Pulmonary and Critical Care Medicine Staff Physician [facility] Pager: [phone], If no answer or between  15:00h - 7:00h: call [phone]  10/02/2016 3:41 AM     Ct Chest High Resolution  Result Date: 10/01/2016 CLINICAL DATA:  70 year old female with history of chronic cough. Irritated larynx. Dyspnea on exertion. Shortness of breath after coughing. History of rheumatoid arthritis. EXAM: CT CHEST WITHOUT CONTRAST TECHNIQUE: Multidetector CT imaging of the chest was performed following the standard protocol without intravenous contrast. High resolution imaging of the lungs, as well as inspiratory and expiratory imaging, was performed. COMPARISON:  No priors. FINDINGS: Cardiovascular: Heart size is borderline enlarged. There is no significant pericardial fluid, thickening or pericardial calcification. Atherosclerotic calcifications in the thoracic aorta. Mediastinum/Nodes: No pathologically enlarged mediastinal or hilar lymph nodes. Please note that accurate exclusion of hilar adenopathy is limited on noncontrast CT scans. Esophagus is unremarkable in appearance. No axillary lymphadenopathy. Lungs/Pleura: High-resolution images demonstrate some very mild areas of peripheral predominant ground-glass attenuation and septal thickening scattered throughout the lungs bilaterally, most evident in the mid to lower lungs. There is some associated peripheral bronchiolectasis. A few scattered areas of very mild traction cylindrical bronchiectasis are noted, predominantly in the lower lobes of the lungs. No honeycombing. Inspiratory and expiratory imaging demonstrates some moderate air trapping, indicative of small airways disease. No acute consolidative airspace disease. No pleural effusions. No suspicious appearing pulmonary  nodules or masses. Upper Abdomen: Status post cholecystectomy. Musculoskeletal: There are no aggressive appearing lytic or blastic lesions noted in the visualized portions of the skeleton. IMPRESSION: 1. The appearance of the lungs does suggest interstitial lung disease. 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 The possibility of early usual interstitial pneumonia (UIP) is not entirely excluded, however, and repeat high-resolution chest CT is recommended in 12 months to assess for temporal changes in the appearance of the lung parenchyma. 2. Moderate air trapping, indicative of small airways disease. 3. Aortic atherosclerosis. Electronically Signed   By: Trudie Reedaniel  Entrikin M.D.   On: 10/01/2016 10:56   Ct Maxillofacial Ltd Wo Cm  Result Date: 10/01/2016 CLINICAL DATA:  Patient with chronic cough, irritated larynx, DOE and sometimes really SOB after coughing/ patient has RA. EXAM: CT PARANASAL SINUS LIMITED WITHOUT CONTRAST TECHNIQUE: Non-contiguous multidetector CT images of the paranasal sinuses were obtained in a single plane without contrast. COMPARISON:  None. FINDINGS: Paranasal sinuses are normally aerated. Very minimal mucoperiosteal thickening identified within the right frontal sinus. No air-fluid levels or polypoid soft tissue density identified. The ostiomeatal units are incidentally imaged and the visualized portion visualized portions of the orbits have a normal appearance. Appear normal. IMPRESSION: 1. No evidence for acute sinusitis. 2. Very mild mucoperiosteal thickening of the right frontal air cell. Electronically Signed   By: Norva PavlovElizabeth  Brown M.D.   On: 10/01/2016 08:25

## 2016-10-03 ENCOUNTER — Other Ambulatory Visit: Payer: Self-pay | Admitting: Internal Medicine

## 2016-10-03 DIAGNOSIS — Z9289 Personal history of other medical treatment: Secondary | ICD-10-CM

## 2016-10-03 NOTE — Telephone Encounter (Signed)
Pt called return phone call, can be reached at [phone], will not be available until after 12 pm

## 2016-10-03 NOTE — Telephone Encounter (Signed)
Spoke with Nichole Cruz and notified of results per Dr. Marchelle Gearingamaswamy. Nichole Cruz verbalized understanding and denied any questions. PFT scheduled for 10/21/16 at 1 pm

## 2016-10-14 ENCOUNTER — Telehealth: Payer: Self-pay | Admitting: Internal Medicine

## 2016-10-14 NOTE — Telephone Encounter (Signed)
Respiratory now closed [facility] to her 11.29.17 ov w/ MR

## 2016-10-15 ENCOUNTER — Ambulatory Visit (INDEPENDENT_AMBULATORY_CARE_PROVIDER_SITE_OTHER): Payer: Medicare Other | Admitting: Internal Medicine

## 2016-10-15 DIAGNOSIS — R05 Cough: Secondary | ICD-10-CM | POA: Diagnosis not present

## 2016-10-15 DIAGNOSIS — R053 Chronic cough: Secondary | ICD-10-CM

## 2016-10-15 LAB — PULMONARY FUNCTION TEST
DL/VA % pred: 95 %
DL/VA: 4.71 ml/min/mmHg/L
DLCO UNC % PRED: 84 %
DLCO cor % pred: 84 %
DLCO cor: 21.67 ml/min/mmHg
DLCO unc: 21.67 ml/min/mmHg
FEF 25-75 POST: 3.27 L/s
FEF 25-75 PRE: 2.73 L/s
FEF2575-%Change-Post: 19 %
FEF2575-%PRED-POST: 177 %
FEF2575-%Pred-Pre: 148 %
FEV1-%Change-Post: 8 %
FEV1-%PRED-POST: 101 %
FEV1-%PRED-PRE: 93 %
FEV1-POST: 2.32 L
FEV1-PRE: 2.13 L
FEV1FVC-%Change-Post: 0 %
FEV1FVC-%PRED-PRE: 113 %
FEV6-%Change-Post: 9 %
FEV6-%PRED-POST: 95 %
FEV6-%Pred-Pre: 86 %
FEV6-POST: 2.76 L
FEV6-Pre: 2.51 L
FEV6FVC-%PRED-POST: 105 %
FEV6FVC-%Pred-Pre: 105 %
FVC-%Change-Post: 9 %
FVC-%Pred-Post: 91 %
FVC-%Pred-Pre: 82 %
FVC-Post: 2.76 L
FVC-Pre: 2.51 L
PRE FEV1/FVC RATIO: 85 %
PRE FEV6/FVC RATIO: 100 %
Post FEV1/FVC ratio: 84 %
Post FEV6/FVC ratio: 100 %
RV % PRED: 76 %
RV: 1.75 L
TLC % pred: 92 %
TLC: 4.79 L

## 2016-10-15 NOTE — Telephone Encounter (Signed)
Spoke with pt. She has been scheduled to do her PFT today here at our office. Nothing further was needed.

## 2016-10-27 ENCOUNTER — Other Ambulatory Visit: Payer: Self-pay | Admitting: Internal Medicine

## 2016-10-27 ENCOUNTER — Ambulatory Visit: Payer: Medicare Other | Attending: Internal Medicine

## 2016-10-27 DIAGNOSIS — R498 Other voice and resonance disorders: Secondary | ICD-10-CM | POA: Diagnosis not present

## 2016-10-28 NOTE — Therapy (Signed)
[facility] [address] [place], [place], [address] Phone: [phone]   Fax:  [phone]  Speech Language Pathology Evaluation  Patient Details  Name: Nichole Cruz MRN: [id number] Date of Birth: Jan 21, 1946 Referring Provider: Kalman Shan, M.D.  Encounter Date: 10/27/2016      End of Session - 10/28/16 1240    Visit Number 1   Number of Visits 9   Date for SLP Re-Evaluation 12/12/16   SLP Start Time 0850   SLP Stop Time  0931   SLP Time Calculation (min) 41 min   Activity Tolerance Patient tolerated treatment well      Past Medical History:  Diagnosis Date  . Allergy   . Anxiety   . Hypertension   . RA (rheumatoid arthritis) (HCC)   . Stroke [facility])    march 2015    Past Surgical History:  Procedure Laterality Date  . APPENDECTOMY    . BLADDER SURGERY    . CESAREAN SECTION    . CHOLECYSTECTOMY    . EYE SURGERY    . TUBAL LIGATION    . VAGINAL HYSTERECTOMY      There were no vitals filed for this visit.      Subjective Assessment - 10/28/16 1230    Subjective "I don't know why I'm here. I can talk fine."   Currently in Pain? No/denies            SLP Evaluation [facility] - 10/28/16 1230      SLP Visit Information   SLP Received On 10/27/16   Referring Provider Kalman Shan, M.D.   Onset Date 10-15 years ago   Medical Diagnosis irritable larynx/vocal cord dysfunction (VCD)     General Information   HPI Pt reports coughing attacks consistently with candle scents, dry hacking cough intermittently througout the day, better (75% less symptoms) since prescribed gabapentin.     Prior Functional Status   Cognitive/Linguistic Baseline Within functional limits     Cognition   Overall Cognitive Status Within Functional Limits for tasks assessed     Auditory Comprehension   Overall Auditory Comprehension Appears within functional limits for tasks assessed     Verbal Expression    Overall Verbal Expression Appears within functional limits for tasks assessed     Oral Motor/Sensory Function   Overall Oral Motor/Sensory Function Appears within functional limits for tasks assessed     Motor Speech   Overall Motor Speech Impaired   Phonation Hoarse  mild hoarseness   Intelligibility Intelligible   Phonation --      Pt and SLP discussed handout of vocal cord dysfunction (VCD) in order to educate pt about VCD and for SLP to assess triggers/causes/signs and symptoms of VCD. Pt presents with mild hoarse voice.  Pt's symptoms of VCD include feeling like it is difficult to get air into and out of lungs, frequent cough and/or throat clearing, hoarse voice. Causes were ID'd as GERD (possible), post-nasal drip, intense odors, and voice use.  SLP and pt discussed relaxation techniques for pt to think about when VCD episodes occur including mental image of relaxation (lying on her recliner, limp), feeling tension/tightness in throat melt away or evaporate, and imagining the throat expanding. SLP educated pt about and taught a sniff-pursed lip blow for pt to try when in a VCD episode.  Pt was educated about abdominal breathing, how it is essential in inhibiting/eliminating symptoms of VCD. SLP assessed pt to be a chest breather. The pt will benefit  from skilled ST addressing abdominal breathing (AB), and relaxation strategies, all to eliminate/reduce VCD and thus increase pt's QOL. SLP guided pt through a period of diagnostic therapy for abdominal breathing and pt was approx 90% successful at obtaining AB at rest. This took approximately 7 minutes.                     SLP Education - 10/28/16 1240    Education provided Yes   Education Details vocal cord dysfunction, abdominal breathing, relaxation techniques   Person(s) Educated Patient   Methods Explanation;Demonstration;Verbal cues;Handout   Comprehension Verbalized understanding;Returned demonstration           SLP Short Term Goals - 10/28/16 1243      SLP SHORT TERM GOAL #1   Title pt will demo abdominal breathing at rest 85% success for 5 minutes   Time 3   Period Weeks   Status New     SLP SHORT TERM GOAL #2   Title pt will demo abdominal breathing 18/20 when responding with 1-sentence responses   Time 3   Period Weeks   Status New          SLP Long Term Goals - 10/28/16 1244      SLP LONG TERM GOAL #1   Title pt will objectively report reduction in VCD symptoms by 85% since prior to Dr. 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    Speech Therapy Frequency 2x / week   Duration --  6 weeks   Treatment/Interventions Compensatory strategies;Patient/family education;Functional tasks;Cueing hierarchy;Internal/external aids;SLP instruction and feedback   Potential to Achieve Goals Good      Patient will benefit from skilled therapeutic intervention in order to improve the following deficits and impairments:   Other voice and resonance disorders (CODE)      G-Codes - 10/27/16 1732    Functional Assessment Tool Used noms - 6 (approx 15-20% impaired)   Functional Limitations Voice   Voice Current Status (G9171) At least 20 percent but less than 40 percent impaired, limited or restricted   Voice Goal Status ([id number]([id number]) At least 1 percent but less than 20 percent impaired, limited or restricted      Problem List Patient Active Problem List   Diagnosis Date Noted  . Irritable larynx 09/26/2016  . Dyspnea on  exertion 09/26/2016  . History of rheumatoid arthritis 09/26/2016  . CHOLELITHIASIS 10/09/2008  . DYSPEPSIA 09/26/2008  . 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 Chronic cough 09/26/2008  . RUQ PAIN 09/26/2008  . ABDOMINAL PAIN-EPIGASTRIC 09/26/2008  . HELICOBACTER PYLORI GASTRITIS 03/29/2008  . HYPOTHYROIDISM 03/29/2008  . HYPERTENSION 03/29/2008  . GERD 03/29/2008  . CONSTIPATION, CHRONIC 03/29/2008  . IRRITABLE BOWEL SYNDROME 03/29/2008  . PERIPHERAL EDEMA 03/29/2008  . HIATAL HERNIA 04/15/1999    Cattie Tineo ,MS, CCC-SLP  10/28/2016, 5:09 PM  [facility] [address] [place], [place],  [id number] Phone: [phone]   Fax:  [phone]  Name: Nichole Cruz MRN: [id number] Date of Birth: Mar 31, 1946

## 2016-10-28 NOTE — Patient Instructions (Signed)
Practice your abdominal breathing twice a day for 10-15 mintues. REmember the techniques we discussed to think about during an episode of coughing: - throat getting wider and wider - the cough feeling melting away or evaporating like a steam - sniff & easy blow  - picturing yourself on your recliner, limp and relaxed

## 2016-11-12 ENCOUNTER — Ambulatory Visit (INDEPENDENT_AMBULATORY_CARE_PROVIDER_SITE_OTHER): Payer: Medicare Other | Admitting: Internal Medicine

## 2016-11-12 ENCOUNTER — Encounter (INDEPENDENT_AMBULATORY_CARE_PROVIDER_SITE_OTHER): Payer: Self-pay

## 2016-11-12 ENCOUNTER — Encounter: Payer: Self-pay | Admitting: Internal Medicine

## 2016-11-12 ENCOUNTER — Other Ambulatory Visit: Payer: Medicare Other

## 2016-11-12 VITALS — BP 150/88 | HR 64 | Ht 66.0 in | Wt 196.0 lb

## 2016-11-12 DIAGNOSIS — J849 Interstitial pulmonary disease, unspecified: Secondary | ICD-10-CM | POA: Diagnosis not present

## 2016-11-12 DIAGNOSIS — R053 Chronic cough: Secondary | ICD-10-CM

## 2016-11-12 DIAGNOSIS — J387 Other diseases of larynx: Secondary | ICD-10-CM

## 2016-11-12 DIAGNOSIS — R05 Cough: Secondary | ICD-10-CM | POA: Diagnosis not present

## 2016-11-12 NOTE — Patient Instructions (Addendum)
Chronic cough Irritable larynx   - glad cough is > 90% better with gabapentin and voice rehab  - plan  - cut gabapentin to 300mg  twice daily for 3 weeks  - then 300mg  once daily for 3 weeks   - then 300mg  Monday, wed, Friday for 3 weeks and stop  ILD (interstitial lung disease) (HCC)  - if you have it is very mild on CT chest and likely related to Rheumatoid ARthrotics  - as explained this can be an unpredictable problem   - best to keep an eye given normal PFT and no crackles on exam  - do blood work for [employer]A, SSB, scl-70, ANA, RF, CCP antibody   - repeat HRCT in 1 year - supine and prone  - respect desire to avoid flu shot  Folowup  4 months or sooner

## 2016-11-12 NOTE — Progress Notes (Signed)
Subjective:     Patient ID: Nichole Cruz, female   DOB: 1946-07-21, 70 y.o.   MRN: [id number]  HPI   PCP Nichole NEVILL, MD   HPI  IOV 09/26/2016  Chief Complaint  Patient presents with  . Pulmonary Consult    Self referral for dry chronic cough x several years. Pt c/o SOB only when coughing, fatigue, and night sweats x 6 months. Pt deneis CP/tightness and f/c/s.   70 year old female self-referred for chronic cough. Her brother Nichole Cruz used to be taken care of by myself and apparently he recommended that I see her for chronic cough  According to the patient she said chronic cough for the last 10 or 15 years. She used to see Nichole. Alroy DustScott Cruz in our office . Apparently no etiology for the cough has been found. The cough is a moderate severity. It is been progressive in the last few years. Associated with the cough in the last few years has been insidious onset of shortness of breath that is present on walking 1 block and relieved by exertion. The quality of her cough is dry but recently in the last few years occasionally she brings up sputum. Cough is present mainly in the daytime but in the last few years also present occasionally at night. It does wake her up. This no associated wheezing. Cough is made worse by swallowing but she says a few months ago a gastroenterologist ruled out dysphagia. Several months ago she also had allergy [facility] but these did not help. She's not tried inhalers or prednisone for the same in the recent few years. Talking does make her cough worse. Having lozenges or peppermint staying quite drinking water can make her cough better. She does clear her throat a lot. She does feel a tickle in the back of her throat. There is no gag as such. RSI cough score is 26 and suggestive of irritable larynx syndrome.  Cough associated history - Sinus: She does have mild sinus drainage. Allergy shots did not help cough - Acid reflux  history: She feels this is well controlled on PPI - Pulmonary history: Denies asthma pulmonary fibrosis or any pulmonary issues. Remote limited smoker - Other history: She does have rheumatoid arthritis not otherwise specified. She believes this is mild. She is not on any medication. She does not see her rheumatologist for the same.      cxr 2009 - clear ; personally visualized  feno 09/26/2016 - 13ppb and normal     has a past medical history of Allergy; Anxiety; Hypertension; RA (rheumatoid arthritis) (HCC); and Stroke (HCC).   reports that she quit smoking about 49 years ago. Her smoking use included Cigarettes. She started smoking about 54 years ago. She has a 3.75 pack-year smoking history. She has never used smokeless tobacco.  OV 11/12/2016  Chief Complaint  Patient presents with  . Follow-up    Pt states her cough has improved since last OV. Pt states her cough is 95% better. Pt denies SOB, CP/tightness.      [name]= Returns for follow-up chronic cough. Last visit started nasal steroid and gabapentin and voice rehabilitation. She reports the cough is improved 95% and feels extremely relieved. She is now completed the gabapentin since mid October 2017 which is approximately 6 weeks. She feels she can start slowly tapering it off. Evaluation for chronic cough showed that her CT sinuses clear and she had normal lung functions tests including DLCO  but her high-resolution CT chest according to chest radiologist suggests mild/early interstitial lung disease. Off note she does not have crackles on  Exam. Personally visualized image of ct  Results for Nichole Cruz, Nichole Cruz (MRN [id number]) as of 11/12/2016 15:23  Ref. Range 10/15/2016 12:54  FVC-Pre Latest Units: L 2.51  FVC-%Pred-Pre Latest Units: % 82  FEV1-Pre Latest Units: L 2.13  FEV1-%Pred-Pre Latest Units: % 93  Pre FEV1/FVC ratio Latest Units: % 85  FEV1FVC-%Pred-Pre Latest Units: % 113   Results for Nichole Cruz, Nichole Cruz  (MRN [id number]) as of 11/12/2016 15:23  Ref. Range 10/15/2016 12:54  TLC Latest Units: L 4.79  TLC % pred Latest Units: % 92  Results for Nichole Cruz, Nichole Cruz (MRN [id number]) as of 11/12/2016 15:23  Ref. Range 10/15/2016 12:54  DLCO cor Latest Units: ml/min/mmHg 21.67  DLCO cor % pred Latest Units: % 84    Nichole Gretta CoolKouffman Reflux Symptom Index (> 13-15 suggestive of LPR cough) 09/26/2016  11/12/2016   Hoarseness of problem with voice 3   Clearing  Of Throat 0   Excess throat mucus or feeling of post nasal drip 2   Difficulty swallowing food, liquid or tablets 5   Cough after eating or lying down 5   Breathing difficulties or choking episodes 2   Troublesome or annoying cough 5   Sensation of something sticking in throat or lump in throat 4   Heartburn, chest pain, indigestion, or stomach acid coming up 0   TOTAL 26       has a past medical history of Allergy; Anxiety; Hypertension; RA (rheumatoid arthritis) (HCC); and Stroke (HCC).   reports that she quit smoking about 49 years ago. Her smoking use included Cigarettes. She started smoking about 54 years ago. She has a 3.75 pack-year smoking history. She has never used smokeless tobacco.  Past Surgical History:  Procedure Laterality Date  . APPENDECTOMY    . BLADDER SURGERY    . CESAREAN SECTION    . CHOLECYSTECTOMY    . EYE SURGERY    . TUBAL LIGATION    . VAGINAL HYSTERECTOMY      Allergies  Allergen Reactions  . Codeine     REACTION: Nausea, vomiting    Immunization History  Administered Date(s) Administered  . Pneumococcal-Unspecified 12/15/2012    Family History  Problem Relation Age of Onset  . Heart disease Father   . Heart disease Sister   . Emphysema Brother   . Rheum arthritis Brother   . Heart disease Brother      Current Outpatient Prescriptions:  .  cholecalciferol (VITAMIN D) 1000 units tablet, Take 2,000 Units by mouth 2 (two) times daily., Disp: , Rfl:  .  citalopram (CELEXA) 10 MG tablet, Take 10  mg by mouth 2 (two) times daily as needed., Disp: , Rfl:  .  fluticasone (FLONASE) 50 MCG/ACT nasal spray, Place 2 sprays into both nostrils daily., Disp: 16 g, Rfl: 2 .  gabapentin (NEURONTIN) 300 MG capsule, Take 1 capsule (300 mg total) by mouth 3 (three) times daily., Disp: 90 capsule, Rfl: 5 .  labetalol (NORMODYNE) 100 MG tablet, Take 100 mg by mouth 2 (two) times daily., Disp: , Rfl:  .  omeprazole (PRILOSEC) 20 MG capsule, Take 20 mg by mouth daily., Disp: , Rfl:     Review of Systems .,vitals    Objective:   Physical Exam  Constitutional: She is oriented to person, place, and time. She appears well-developed and well-nourished. No distress.  HENT:  Head: Normocephalic and atraumatic.  Right Ear: External ear normal.  Left Ear: External ear normal.  Mouth/Throat: Oropharynx is clear and moist. No oropharyngeal exudate.  Eyes: Conjunctivae and EOM are normal. Pupils are equal, round, and reactive to light. Right eye exhibits no discharge. Left eye exhibits no discharge. No scleral icterus.  Neck: Normal range of motion. Neck supple. No JVD present. No tracheal deviation present. No thyromegaly present.  Cardiovascular: Normal rate, regular rhythm, normal heart sounds and intact distal pulses.  Exam reveals no gallop and no friction rub.   No murmur heard. Pulmonary/Chest: Effort normal and breath sounds normal. No respiratory distress. She has no wheezes. She has no rales. She exhibits no tenderness.  Abdominal: Soft. Bowel sounds are normal. She exhibits no distension and no mass. There is no tenderness. There is no rebound and no guarding.  Musculoskeletal: Normal range of motion. She exhibits no edema or tenderness.  Lymphadenopathy:    She has no cervical adenopathy.  Neurological: She is alert and oriented to person, place, and time. She has normal reflexes. No cranial nerve deficit. She exhibits normal muscle tone. Coordination normal.  Skin: Skin is warm and dry. No rash  noted. She is not diaphoretic. No erythema. No pallor.  Psychiatric: She has a normal mood and affect. Her behavior is normal. Judgment and thought content normal.  Vitals reviewed.   Vitals:   11/12/16 1509  BP: (!) 150/88  Pulse: 64  SpO2: 95%  Weight: 196 lb (88.9 kg)  Height: 5\' 6"  (1.676 m)        Assessment:       ICD-9-CM ICD-10-CM   1. Chronic cough 786.2 R05   2. 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 ILD (interstitial lung disease) (HCC) 515 J84.9 Sjogrens syndrome-A extractable nuclear antibody     Sjogrens syndrome-Cruz extractable nuclear antibody     Anti-scleroderma antibody     ANA     Rheumatoid Factor     Cyclic citrul peptide antibody, IgG     CT CHEST HIGH RESOLUTION       Plan:     Chronic cough Irritable larynx   - glad cough is > 90% better with gabapentin and voice rehab  - plan  - cut gabapentin to 300mg  twice daily for 3 weeks  - then 300mg  once daily for 3 weeks   - then 300mg  Monday, wed, Friday for 3 weeks and stop  ILD (interstitial lung disease) (HCC) - new dx  - if you have it is very mild on CT chest and likely related to Rheumatoid ARthritis and might be contrubiting to cough which as neurogenic in nature  - as explained this can be an unpredictable problem   - best to keep an eye given normal PFT and no crackles on exam  - do blood work for [employer], SSB, scl-70, ANA, RF, CCP antibody   - repeat HRCT in 1 year - supine and prone  - respect desire to avoid flu shot  [place]  4 months or sooner      Nichole. Kalman Shan, M.D., [facility] Pulmonary and Critical Care Medicine Staff Physician [facility] Pager: [phone], If no answer or between  15:00h - 7:00h: call [phone]  11/12/2016 3:37 PM

## 2016-11-13 LAB — ANA: Anti Nuclear Antibody(ANA): NEGATIVE

## 2016-11-13 LAB — CYCLIC CITRUL PEPTIDE ANTIBODY, IGG

## 2016-11-13 LAB — SJOGRENS SYNDROME-A EXTRACTABLE NUCLEAR ANTIBODY: SSA (Ro) (ENA) Antibody, IgG: <1

## 2016-11-13 LAB — SJOGRENS SYNDROME-B EXTRACTABLE NUCLEAR ANTIBODY: SSB (La) (ENA) Antibody, IgG: <1

## 2016-11-13 LAB — RHEUMATOID FACTOR

## 2016-11-13 LAB — ANTI-SCLERODERMA ANTIBODY: SCLERODERMA (SCL-70) (ENA) ANTIBODY, IGG: NEGATIVE

## 2016-11-14 ENCOUNTER — Ambulatory Visit: Payer: Medicare Other | Attending: Internal Medicine

## 2016-11-14 DIAGNOSIS — R498 Other voice and resonance disorders: Secondary | ICD-10-CM | POA: Insufficient documentation

## 2016-11-14 NOTE — Progress Notes (Signed)
lmtcb for pt.  

## 2016-11-14 NOTE — Therapy (Signed)
[facility] [address] [place], [place], [address] Phone: [phone]   Fax:  [phone]  Speech Language Pathology Treatment  Patient Details  Name: Nichole Cruz MRN: [id number] Date of Birth: 1946/01/07 Referring Provider: Brand Males, M.D.  Encounter Date: 11/14/2016      End of Session - 11/14/16 0850    Visit Number 2   Number of Visits 9   Date for SLP Re-Evaluation 12/12/16   SLP Start Time 0805   SLP Stop Time  0840  discharge day   SLP Time Calculation (min) 35 min      Past Medical History:  Diagnosis Date  . Allergy   . Anxiety   . Hypertension   . RA (rheumatoid arthritis) ([place])   . Stroke [facility])    march 2015    Past Surgical History:  Procedure Laterality Date  . APPENDECTOMY    . BLADDER SURGERY    . CESAREAN SECTION    . CHOLECYSTECTOMY    . EYE SURGERY    . TUBAL LIGATION    . VAGINAL HYSTERECTOMY      There were no vitals filed for this visit.      Subjective Assessment - 11/14/16 0810    Subjective 'He said I have ILD." (pt states this is a disease of the lungs)   Currently in Pain? No/denies               ADULT SLP TREATMENT - 11/14/16 0811      General Information   Behavior/Cognition Cooperative;Alert;Pleasant mood     Treatment Provided   Treatment provided Cognitive-Linquistic     Cognitive-Linquistic Treatment   Treatment focused on Voice   Skilled Treatment Pt reported that her symptoms have been reduced approx 90% since on gabapentin. At rest, pt used abdominal breathing (AB) 95% of the time in 3 1/2 minutes. In picture description tasks, pt used AB 95% of the time, and in conversation outside of the ST room, AB was used at least 90% of the time. At this time, pt is comfortable with d/c, as is this SLP. Pt stated she used sniff-blow technique routinely since eval to subside VCD symptoms.     Assessment / Recommendations / Plan   Plan Discharge  SLP treatment due to (comment)  pt is satisfied with progress     Progression Toward Goals   Progression toward goals --  see goal update            SLP Short Term Goals - 11/14/16 1736      SLP SHORT TERM GOAL #1   Title pt will demo abdominal breathing at rest 85% success for 5 minutes   Status Achieved     SLP SHORT TERM GOAL #2   Title pt will demo abdominal breathing 18/20 when responding with 1-sentence responses   Status Achieved          SLP Long Term Goals - 11/14/16 1736      SLP LONG TERM GOAL #1   Title pt will objectively report reduction in VCD symptoms by 85% since prior to Dr. Chase Caller initial visit   Status Achieved     SLP LONG TERM GOAL #2   Title pt will respond with multiple sentences in 8 mintues conversation with abdominal breathing 80% of the time   Period Weeks   Status Achieved          Plan - 11/14/16 0851    Clinical Impression Statement Pt  has made excellent gains in last two weeks and has had no episodes of VCD in the last week. Previously, she had episodes rarely and subsided with sniff-blow technique. Pt is appropriate for d/c at this time and she agrees.   Treatment/Interventions Compensatory strategies;Patient/family education;Functional tasks;Cueing hierarchy;Internal/external aids;SLP instruction and feedback   Potential to Achieve Goals Good      Patient will benefit from skilled therapeutic intervention in order to improve the following deficits and impairments:   Other voice and resonance disorders (CODE)      G-Codes - November 19, 2016 1737    Functional Assessment Tool Used noms - 7   Functional Limitations Voice   Voice Goal Status (G9172) At least 1 percent but less than 20 percent impaired, limited or restricted   Voice Discharge Status (P9292) 0 percent impaired, limited or restricted      [place]  Visits from Start of Care: 2  Current functional level related to goals / functional  outcomes: Pt reports she has decr of 90% of VCD symptoms since prior to prescribed gabapentin. Pt is satisfied with progress - d/c appropriate. She has used sniff-blow technique when VCD symptoms rarely occur, and this is successful. She used abdominal breathing routinely while walking and in conversation with SLP outside of [place] room.   Remaining deficits: None   Education / Equipment: Abdominal breathing, nature of VCD  Plan: Patient agrees to discharge.  Patient goals were met. Patient is being discharged due to meeting the stated rehab goals.  ?????and pleased with progress.       Problem List Patient Active Problem List   Diagnosis Date Noted  . ILD (interstitial lung disease) ([place]) 11/12/2016  . Irritable larynx 09/26/2016  . Dyspnea on exertion 09/26/2016  . History of rheumatoid arthritis 09/26/2016  . CHOLELITHIASIS 10/09/2008  . DYSPEPSIA 09/26/2008  . 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 Chronic cough 09/26/2008  . RUQ PAIN 09/26/2008  . ABDOMINAL PAIN-EPIGASTRIC 09/26/2008  . HELICOBACTER PYLORI GASTRITIS 03/29/2008  . HYPOTHYROIDISM 03/29/2008  . HYPERTENSION 03/29/2008  . GERD 03/29/2008  . CONSTIPATION, CHRONIC 03/29/2008  . IRRITABLE BOWEL SYNDROME 03/29/2008  . PERIPHERAL EDEMA 03/29/2008  . HIATAL HERNIA 04/15/1999    [facility] ,MS, CCC-SLP  2016-11-19, 5:37 PM  [facility] [address] [place], [place], [address] Phone: [phone]   Fax:  [phone]   Name: [name] MRN: [id number] Date of Birth: June 13, 1946

## 2016-11-17 ENCOUNTER — Telehealth: Payer: Self-pay | Admitting: Internal Medicine

## 2016-11-17 NOTE — Progress Notes (Signed)
Called and spoke to pt. Pt is requesting the results of lab work and is requesting the results be mailed to her home address. Informed her of the results per MR and that the results were placed in outgoing mail. Pt verbalized understanding and denied any further questions or concerns at this time.

## 2016-11-17 NOTE — Telephone Encounter (Signed)
Patient is returning phone call.  °

## 2016-11-17 NOTE — Telephone Encounter (Addendum)
Called and spoke to pt. Pt is requesting the results of lab work and is requesting the results be mailed to her home address. Informed her of the results per MR and that the results were placed in outgoing mail. Pt verbalized understanding and denied any further questions or concerns at this time.    Notes Recorded by Kalman ShanMurali Ramaswamy, MD on 11/13/2016 at 5:28 PM EST Autoimmune negative.

## 2016-11-21 ENCOUNTER — Ambulatory Visit: Payer: Medicare Other

## 2016-12-01 ENCOUNTER — Encounter: Payer: Medicare Other | Admitting: Speech Pathology

## 2016-12-05 ENCOUNTER — Ambulatory Visit: Payer: Medicare Other

## 2016-12-09 ENCOUNTER — Encounter: Payer: Medicare Other | Admitting: Speech Pathology

## 2016-12-12 ENCOUNTER — Ambulatory Visit: Payer: Medicare Other

## 2016-12-12 DIAGNOSIS — Z9289 Personal history of other medical treatment: Secondary | ICD-10-CM

## 2017-03-30 ENCOUNTER — Encounter: Payer: Self-pay | Admitting: Internal Medicine

## 2017-03-30 ENCOUNTER — Ambulatory Visit (INDEPENDENT_AMBULATORY_CARE_PROVIDER_SITE_OTHER): Payer: Medicare Other | Admitting: Internal Medicine

## 2017-03-30 DIAGNOSIS — J849 Interstitial pulmonary disease, unspecified: Secondary | ICD-10-CM | POA: Diagnosis not present

## 2017-03-30 DIAGNOSIS — R053 Chronic cough: Secondary | ICD-10-CM

## 2017-03-30 DIAGNOSIS — R05 Cough: Secondary | ICD-10-CM | POA: Diagnosis not present

## 2017-03-30 NOTE — Progress Notes (Signed)
Subjective:     Patient ID: Nichole Cruz, female   DOB: [birth year], 71 y.o.   MRN: [id number]  HPI PCP GATES,ROBERT NEVILL, MD   HPI  IOV 09/26/2016  Chief Complaint  Patient presents with  . Pulmonary Consult    Self referral for dry chronic cough x several years. Pt c/o SOB only when coughing, fatigue, and night sweats x 6 months. Pt deneis CP/tightness and f/c/s.   71 year old female self-referred for chronic cough. Her brother Ina Kick used to be taken care of by myself and apparently he recommended that I see her for chronic cough  According to the patient she said chronic cough for the last 10 or 15 years. She used to see Dr. Alroy Dust in our office . Apparently no etiology for the cough has been found. The cough is a moderate severity. It is been progressive in the last few years. Associated with the cough in the last few years has been insidious onset of shortness of breath that is present on walking 1 block and relieved by exertion. The quality of her cough is dry but recently in the last few years occasionally she brings up sputum. Cough is present mainly in the daytime but in the last few years also present occasionally at night. It does wake her up. This no associated wheezing. Cough is made worse by swallowing but she says a few months ago a gastroenterologist ruled out dysphagia. Several months ago she also had allergy shots with an allergist at [facility] but these did not help. She's not tried inhalers or prednisone for the same in the recent few years. Talking does make her cough worse. Having lozenges or peppermint staying quite drinking water can make her cough better. She does clear her throat a lot. She does feel a tickle in the back of her throat. There is no gag as such. RSI cough score is 26 and suggestive of irritable larynx syndrome.  Cough associated history - Sinus: She does have mild sinus drainage. Allergy shots did not help cough - Acid reflux history:  She feels this is well controlled on PPI - Pulmonary history: Denies asthma pulmonary fibrosis or any pulmonary issues. Remote limited smoker - Other history: She does have rheumatoid arthritis not otherwise specified. She believes this is mild. She is not on any medication. She does not see her rheumatologist for the same.      cxr 2009 - clear ; personally visualized  feno 09/26/2016 - 13ppb and normal     has a past medical history of Allergy; Anxiety; Hypertension; RA (rheumatoid arthritis) (HCC); and Stroke (HCC).   reports that she quit smoking about 49 years ago. Her smoking use included Cigarettes. She started smoking about 54 years ago. She has a 3.75 pack-year smoking history. She has never used smokeless tobacco.  OV 11/12/2016  Chief Complaint  Patient presents with  . Follow-up    Pt states her cough has improved since last OV. Pt states her cough is 95% better. Pt denies SOB, CP/tightness.      Nichole Cruz= Returns for follow-up chronic cough. Last visit started nasal steroid and gabapentin and voice rehabilitation. She reports the cough is improved 95% and feels extremely relieved. She is now completed the gabapentin since mid October 2017 which is approximately 6 weeks. She feels she can start slowly tapering it off. Evaluation for chronic cough showed that her CT sinuses clear and she had normal lung functions tests including DLCO but her  high-resolution CT chest according to chest radiologist suggests mild/early interstitial lung disease. Off note she does not have crackles on  Exam. Personally visualized image of ct    OV 03/30/2017  Chief Complaint  Patient presents with  . Follow-up    Pt states her cough has improved and states the only time she has a cough is at night when she lays down, pt states this is from PND. Pt denies significant SOB and CP/tightness.     Follow-up chronic cough in the presence of postnasal drip and irritable larynx she still is  off the gabapentin. Cough continues doing mild. It has not gotten worse. It is mostly at night and related to postnasal drainage. When she takes the Flonase she clears it up in the cough improves. She also clears her throat. Symptoms are completely manageable  Follow-up interstitial lung disease: This was noticed in October 2017 as part of her workup for cough. The CT scan was read by thoracic radiology and the have mild ILD. We did autoimmune panel 11/12/2016 including rheumatoid factor and CCP and this was negative. She does have a history of rheumatoid arthritis not otherwise specified but serology is negative. At this point in time the cough is improved and she says that her dyspnea is stable which is also activities and yard work. Walking desaturation test 185 feet 3 laps on room air: Did not desaturate. No associated chest pain.    has a past medical history of Allergy; Anxiety; Hypertension; RA (rheumatoid arthritis) (HCC); and Stroke (HCC).   reports that she quit smoking about 50 years ago. Her smoking use included Cigarettes. She started smoking about 55 years ago. She has a 3.75 pack-year smoking history. She has never used smokeless tobacco.  Past Surgical History:  Procedure Laterality Date  . APPENDECTOMY    . BLADDER SURGERY    . CESAREAN SECTION    . CHOLECYSTECTOMY    . EYE SURGERY    . TUBAL LIGATION    . VAGINAL HYSTERECTOMY      Allergies  Allergen Reactions  . Codeine     REACTION: Nausea, vomiting    Immunization History  Administered Date(s) Administered  . Pneumococcal-Unspecified 12/15/2012    Family History  Problem Relation Age of Onset  . Heart disease Father   . Heart disease Sister   . Emphysema Brother   . Rheum arthritis Brother   . Heart disease Brother      Current Outpatient Prescriptions:  .  cholecalciferol (VITAMIN D) 1000 units tablet, Take 2,000 Units by mouth 2 (two) times daily., Disp: , Rfl:  .  citalopram (CELEXA) 10 MG tablet,  Take 10 mg by mouth 2 (two) times daily as needed., Disp: , Rfl:  .  fluticasone (FLONASE) 50 MCG/ACT nasal spray, Place 2 sprays into both nostrils daily., Disp: 16 g, Rfl: 2 .  gabapentin (NEURONTIN) 300 MG capsule, Take 1 capsule (300 mg total) by mouth 3 (three) times daily., Disp: 90 capsule, Rfl: 5 .  labetalol (NORMODYNE) 100 MG tablet, Take 100 mg by mouth 2 (two) times daily., Disp: , Rfl:  .  omeprazole (PRILOSEC) 20 MG capsule, Take 20 mg by mouth daily., Disp: , Rfl:    Results for Nichole Cruz, Nichole Cruz (MRN [id number]) as of 11/12/2016 15:23  Ref. Range 10/15/2016 12:54  FVC-Pre Latest Units: L 2.51  FVC-%Pred-Pre Latest Units: % 82  FEV1-Pre Latest Units: L 2.13  FEV1-%Pred-Pre Latest Units: % 93  Pre FEV1/FVC ratio Latest Units: % 85  FEV1FVC-%Pred-Pre Latest Units: % 113   Results for Nichole Cruz, Nichole Cruz (MRN [id number]) as of 11/12/2016 15:23  Ref. Range 10/15/2016 12:54  TLC Latest Units: L 4.79  TLC % pred Latest Units: % 92  Results for Nichole Cruz, Nichole Cruz (MRN [id number]) as of 11/12/2016 15:23  Ref. Range 10/15/2016 12:54  DLCO cor Latest Units: ml/min/mmHg 21.67  DLCO cor % pred Latest Units: % 84    Dr Gretta Cool Reflux Symptom Index (> 13-15 suggestive of LPR cough) 09/26/2016  11/12/2016   Hoarseness of problem with voice 3   Clearing  Of Throat 0   Excess throat mucus or feeling of post nasal drip 2   Difficulty swallowing food, liquid or tablets 5   Cough after eating or lying down 5   Breathing difficulties or choking episodes 2   Troublesome or annoying cough 5   Sensation of something sticking in throat or lump in throat 4   Heartburn, chest pain, indigestion, or stomach acid coming up 0   TOTAL 26        Review of Systems     Objective:   Physical Exam  Constitutional: She is oriented to person, place, and time. She appears well-developed and well-nourished. No distress.  HENT:  Head: Normocephalic and atraumatic.  Right Ear: External ear normal.   Left Ear: External ear normal.  Mouth/Throat: Oropharynx is clear and moist. No oropharyngeal exudate.  Eyes: Conjunctivae and EOM are normal. Pupils are equal, round, and reactive to light. Right eye exhibits no discharge. Left eye exhibits no discharge. No scleral icterus.  Neck: Normal range of motion. Neck supple. No JVD present. No tracheal deviation present. No thyromegaly present.  Cardiovascular: Normal rate, regular rhythm, normal heart sounds and intact distal pulses.  Exam reveals no gallop and no friction rub.   No murmur heard. Pulmonary/Chest: Effort normal and breath sounds normal. No respiratory distress. She has no wheezes. She has no rales. She exhibits no tenderness.  No crackles  Abdominal: Soft. Bowel sounds are normal. She exhibits no distension and no mass. There is no tenderness. There is no rebound and no guarding.  Musculoskeletal: Normal range of motion. She exhibits no edema or tenderness.  Lymphadenopathy:    She has no cervical adenopathy.  Neurological: She is alert and oriented to person, place, and time. She has normal reflexes. No cranial nerve deficit. She exhibits normal muscle tone. Coordination normal.  Skin: Skin is warm and dry. No rash noted. She is not diaphoretic. No erythema. No pallor.  Psychiatric: She has a normal mood and affect. Her behavior is normal. Judgment and thought content normal.  Vitals reviewed.   Vitals:   03/30/17 0903  BP: (!) 144/96  Pulse: 60  SpO2: 95%  Weight: 190 lb 6.4 oz (86.4 kg)  Height:  (1.676 m)    Estimated body mass index is 30.73 kg/m as calculated from the following:   Height as of this encounter:  (1.676 m).   Weight as of this encounter: 190 lb 6.4 oz (86.4 kg).      Assessment:       ICD-9-CM ICD-10-CM   1. ILD (interstitial lung disease) (HCC) 515 J84.9   2. Chronic cough 786.2 R05        Plan:     ILD (interstitial lung disease) (HCC) We are not fully sure if this is there  based on CT chest oct 2017. Autoimmune negative. If there, there is risk for worsening. Currently glad youa re  stable with just mild shortness of breath doing household  And yard work   Plan - repeat HRCT supine prone in 6 months  Chronic cough This is due to post nasal drip and irritable throat Glad is improved and only ver mild  Plan Continue cough lozenges as needed and flonase  Followup 6 months after HRCT for ILD   Dr. Kalman Shan, M.D., [facility].C.P Pulmonary and Critical Care Medicine Staff Physician [facility] [facility] Pager: [phone], If no answer or between  15:00h - 7:00h: call [phone]  03/30/2017 9:34 AM

## 2017-03-30 NOTE — Assessment & Plan Note (Signed)
We are not fully sure if this is there based on CT chest oct 2017. Autoimmune negative. If there, there is risk for worsening. Currently glad youa re stable with just mild shortness of breath doing household  And yard work   Plan - repeat HRCT supine prone in 6 months

## 2017-03-30 NOTE — Assessment & Plan Note (Signed)
This is due to post nasal drip and irritable throat Glad is improved and only ver mild  Plan Continue cough lozenges as needed and flonase  Followup 6 months after HRCT for ILD

## 2017-03-30 NOTE — Patient Instructions (Signed)
ILD (interstitial lung disease) (HCC) We are not fully sure if this is there based on CT chest oct 2017. Autoimmune negative. If there, there is risk for worsening. Currently glad youa re stable with just mild shortness of breath doing household  And yard work   Plan - repeat HRCT supine prone in 6 months  Chronic cough This is due to post nasal drip and irritable throat Glad is improved and only ver mild  Plan Continue cough lozenges as needed and flonase  Followup 6 months after HRCT for ILD

## 2017-07-17 ENCOUNTER — Other Ambulatory Visit: Payer: Self-pay | Admitting: Internal Medicine

## 2017-09-18 ENCOUNTER — Encounter: Payer: Self-pay | Admitting: Gastroenterology

## 2017-09-30 ENCOUNTER — Ambulatory Visit (INDEPENDENT_AMBULATORY_CARE_PROVIDER_SITE_OTHER)
Admission: RE | Admit: 2017-09-30 | Discharge: 2017-09-30 | Disposition: A | Discharge status: Home / Self Care | Payer: Medicare Other | Source: Ambulatory Visit | Attending: Internal Medicine | Admitting: Internal Medicine

## 2017-09-30 DIAGNOSIS — J849 Interstitial pulmonary disease, unspecified: Secondary | ICD-10-CM

## 2017-10-02 ENCOUNTER — Ambulatory Visit: Payer: Medicare Other | Admitting: Internal Medicine

## 2017-10-07 ENCOUNTER — Encounter: Payer: Self-pay | Admitting: Internal Medicine

## 2017-10-07 ENCOUNTER — Ambulatory Visit (INDEPENDENT_AMBULATORY_CARE_PROVIDER_SITE_OTHER): Payer: Medicare Other | Admitting: Internal Medicine

## 2017-10-07 VITALS — BP 130/70 | HR 60 | Ht 66.0 in | Wt 182.4 lb

## 2017-10-07 DIAGNOSIS — J387 Other diseases of larynx: Secondary | ICD-10-CM | POA: Diagnosis not present

## 2017-10-07 DIAGNOSIS — R053 Chronic cough: Secondary | ICD-10-CM

## 2017-10-07 DIAGNOSIS — R05 Cough: Secondary | ICD-10-CM | POA: Diagnosis not present

## 2017-10-07 NOTE — Progress Notes (Signed)
Subjective:     Patient ID: Nichole Cruz, female   DOB: 03/20/1946, 71 y.o.   MRN: [id number]  HPI   PCP Nichole NEVILL, MD   HPI  IOV 09/26/2016  Chief Complaint  Patient presents with  . Pulmonary Consult    Self referral for dry chronic cough x several years. Pt c/o SOB only when coughing, fatigue, and night sweats x 6 months. Pt deneis CP/tightness and f/c/s.   71 year old female self-referred for chronic cough. Her brother Nichole Cruz used to be taken care of by myself and apparently he recommended that I see her for chronic cough  According to the patient she said chronic cough for the last 10 or 15 years. She used to see Dr. Alroy DustScott Nadel in our office . Apparently no etiology for the cough has been found. The cough is a moderate severity. It is been progressive in the last few years. Associated with the cough in the last few years has been insidious onset of shortness of breath that is present on walking 1 block and relieved by exertion. The quality of her cough is dry but recently in the last few years occasionally she brings up sputum. Cough is present mainly in the daytime but in the last few years also present occasionally at night. It does wake her up. This no associated wheezing. Cough is made worse by swallowing but she says a few months ago a gastroenterologist ruled out dysphagia. Several months ago she also [facility] but these did not help. She's not tried inhalers or prednisone for the same in the recent few years. Talking does make her cough worse. Having lozenges or peppermint staying quite drinking water can make her cough better. She does clear her throat a lot. She does feel a tickle in the back of her throat. There is no gag as such. RSI cough score is 26 and suggestive of irritable larynx syndrome.  Cough associated history - Sinus: She does have mild sinus drainage. Allergy shots did not help cough - Acid reflux  history: She feels this is well controlled on PPI - Pulmonary history: Denies asthma pulmonary fibrosis or any pulmonary issues. Remote limited smoker - Other history: She does have rheumatoid arthritis not otherwise specified. She believes this is mild. She is not on any medication. She does not see her rheumatologist for the same.      cxr 2009 - clear ; personally visualized  feno 09/26/2016 - 13ppb and normal     has a past medical history of Allergy; Anxiety; Hypertension; RA (rheumatoid arthritis) (HCC); and Stroke (HCC).   reports that she quit smoking about 49 years ago. Her smoking use included Cigarettes. She started smoking about 54 years ago. She has a 3.75 pack-year smoking history. She has never used smokeless tobacco.  OV 11/12/2016  Chief Complaint  Patient presents with  . Follow-up    Pt states her cough has improved since last OV. Pt states her cough is 95% better. Pt denies SOB, CP/tightness.      Ami B Hinote= Returns for follow-up chronic cough. Last visit started nasal steroid and gabapentin and voice rehabilitation. She reports the cough is improved 95% and feels extremely relieved. She is now completed the gabapentin since mid October 2017 which is approximately 6 weeks. She feels she can start slowly tapering it off. Evaluation for chronic cough showed that her CT sinuses clear and she had normal lung functions tests including DLCO  but her high-resolution CT chest according to chest radiologist suggests mild/early interstitial lung disease. Off note she does not have crackles on  Exam. Personally visualized image of ct    OV 03/30/2017  Chief Complaint  Patient presents with  . Follow-up    Pt states her cough has improved and states the only time she has a cough is at night when she lays down, pt states this is from PND. Pt denies significant SOB and CP/tightness.     Follow-up chronic cough in the presence of postnasal drip and irritable larynx she  still is off the gabapentin. Cough continues doing mild. It has not gotten worse. It is mostly at night and related to postnasal drainage. When she takes the Flonase she clears it up in the cough improves. She also clears her throat. Symptoms are completely manageable  Follow-up interstitial lung disease: This was noticed in October 2017 as part of her workup for cough. The CT scan was read by thoracic radiology and the have mild ILD. We did autoimmune panel 11/12/2016 including rheumatoid factor and CCP and this was negative. She does have a history of rheumatoid arthritis not otherwise specified but serology is negative. At this point in time the cough is improved and she says that her dyspnea is stable which is also activities and yard work. Walking desaturation test 185 feet 3 laps on room air: Did not desaturate. No associated chest pain.    has a past medical history of Allergy; Anxiety; Hypertension; RA (rheumatoid arthritis) (HCC); and Stroke (HCC).   reports that she quit smoking about 50 years ago. Her smoking use included Cigarettes. She started smoking about 55 years ago. She has a 3.75 pack-year smoking history. She has never used smokeless tobacco.  OV 10/07/2017  Chief Complaint  Patient presents with  . Follow-up    CT scan done 09/30/17. Pt states that she has been doing good since last visit. Has some coughing when she lays down at night due to sinus drainage. Denies any SOB or CP.    Follow-up chronic cough: This persisted. It is mild but it is tolerable. At this point in time she feels good control with just taking nasal spray. She is not interested in any further workup.She tried speech therapy in the past. She had a repeat CT scan of the chest that I personally visualized. This was done this month October 2018. It does not show any interstitial lung disease. On exam and did not hear any crackles. The official report also suggest that she does not have interstitial lung  disease     has a past medical history of Allergy; Anxiety; Hypertension; RA (rheumatoid arthritis) (HCC); and Stroke (HCC).   reports that she quit smoking about 50 years ago. Her smoking use included Cigarettes. She started smoking about 55 years ago. She has a 3.75 pack-year smoking history. She has never used smokeless tobacco.  Past Surgical History:  Procedure Laterality Date  . APPENDECTOMY    . BLADDER SURGERY    . CESAREAN SECTION    . CHOLECYSTECTOMY    . EYE SURGERY    . TUBAL LIGATION    . VAGINAL HYSTERECTOMY      Allergies  Allergen Reactions  . Codeine     REACTION: Nausea, vomiting  . Latex Rash    Immunization History  Administered Date(s) Administered  . Pneumococcal-Unspecified 12/15/2012    Family History  Problem Relation Age of Onset  . Heart disease Father   .  Heart disease Sister   . Emphysema Brother   . Rheum arthritis Brother   . Heart disease Brother      Current Outpatient Prescriptions:  .  amLODipine (NORVASC) 2.5 MG tablet, 1 TABLET ONCE A DAY IF NEEDED WHEN TOP NUMBER OF BLOOD PRESSURE IS OVER 150, Disp: , Rfl: 11 .  cholecalciferol (VITAMIN D) 1000 units tablet, Take 2,000 Units by mouth 2 (two) times daily., Disp: , Rfl:  .  citalopram (CELEXA) 10 MG tablet, Take 10 mg by mouth 2 (two) times daily as needed., Disp: , Rfl:  .  citalopram (CELEXA) 20 MG tablet, Take 20 mg by mouth 2 (two) times daily., Disp: , Rfl: 6 .  fluticasone (FLONASE) 50 MCG/ACT nasal spray, PLACE 2 SPRAYS INTO BOTH NOSTRILS DAILY., Disp: 16 g, Rfl: 2 .  gabapentin (NEURONTIN) 300 MG capsule, Take 1 capsule (300 mg total) by mouth 3 (three) times daily., Disp: 90 capsule, Rfl: 5 .  labetalol (NORMODYNE) 100 MG tablet, Take 100 mg by mouth 2 (two) times daily., Disp: , Rfl:  .  LORazepam (ATIVAN) 1 MG tablet, Take by mouth., Disp: , Rfl:  .  MULTIPLE VITAMINS-MINERALS PO, Take by mouth., Disp: , Rfl:  .  omeprazole (PRILOSEC) 20 MG capsule, Take 20 mg by mouth  daily., Disp: , Rfl:  .  traZODone (DESYREL) 50 MG tablet, TAKE 1 TO 2 TABLETS AT BEDTIME AS NEEDED INSOMNIA, Disp: , Rfl:    Review of Systems     Objective:   Physical Exam  Constitutional: She is oriented to person, place, and time. She appears well-developed and well-nourished. No distress.  HENT:  Head: Normocephalic and atraumatic.  Right Ear: External ear normal.  Left Ear: External ear normal.  Mouth/Throat: Oropharynx is clear and moist. No oropharyngeal exudate.  Eyes: Pupils are equal, round, and reactive to light. Conjunctivae and EOM are normal. Right eye exhibits no discharge. Left eye exhibits no discharge. No scleral icterus.  Neck: Normal range of motion. Neck supple. No JVD present. No tracheal deviation present. No thyromegaly present.  Cardiovascular: Normal rate, regular rhythm, normal heart sounds and intact distal pulses.  Exam reveals no gallop and no friction rub.   No murmur heard. Pulmonary/Chest: Effort normal and breath sounds normal. No respiratory distress. She has no wheezes. She has no rales. She exhibits no tenderness.  Abdominal: Soft. Bowel sounds are normal. She exhibits no distension and no mass. There is no tenderness. There is no rebound and no guarding.  Musculoskeletal: Normal range of motion. She exhibits no edema or tenderness.  Lymphadenopathy:    She has no cervical adenopathy.  Neurological: She is alert and oriented to person, place, and time. She has normal reflexes. No cranial nerve deficit. She exhibits normal muscle tone. Coordination normal.  Skin: Skin is warm and dry. No rash noted. She is not diaphoretic. No erythema. No pallor.  Psychiatric: She has a normal mood and affect. Her behavior is normal. Judgment and thought content normal.  Vitals reviewed.  Vitals:   10/07/17 1038  BP: 130/70  Pulse: 60  SpO2: 96%  Weight: 182 lb 6.4 oz (82.7 kg)  Height: 5\' 6"  (1.676 m)    Estimated body mass index is 29.44 kg/m as  calculated from the following:   Height as of this encounter: 5\' 6"  (1.676 m).   Weight as of this encounter: 182 lb 6.4 oz (82.7 kg).     Assessment:       ICD-10-CM   1.  Chronic cough R05   2. Irritable larynx J38.7        Plan:     ILD (interstitial lung disease) (HCC) You do not have it based on Oct 2018 CT scan   Chronic cough This is due to post nasal drip and irritable throat Glad is improved and only ver mild  Plan Continue cough lozenges as needed and flonase  Followup 12 months or sooner if needed   Dr. Kalman Shan, M.D., [facility].C.P Pulmonary and Critical Care Medicine Staff Physician [facility] [place] [facility] Pager: [phone], If no answer or between  15:00h - 7:00h: call [phone]  10/07/2017 11:05 AM

## 2017-10-07 NOTE — Patient Instructions (Addendum)
ILD (interstitial lung disease) (HCC) You do not have it based on Oct 2018 CT scan   Chronic cough This is due to post nasal drip and irritable throat Glad is improved and only ver mild  Plan Continue cough lozenges as needed and flonase  Followup 12 months or sooner if needed

## 2018-01-14 ENCOUNTER — Other Ambulatory Visit: Payer: Self-pay | Admitting: Internal Medicine

## 2018-01-14 DIAGNOSIS — Z1231 Encounter for screening mammogram for malignant neoplasm of breast: Secondary | ICD-10-CM

## 2018-01-21 ENCOUNTER — Ambulatory Visit: Payer: Medicare Other

## 2018-01-21 DIAGNOSIS — Z1231 Encounter for screening mammogram for malignant neoplasm of breast: Secondary | ICD-10-CM

## 2018-04-19 IMAGING — CT CT CHEST HIGH RESOLUTION W/O CM
2 of 5 series · 15 of 36 positions shown, 18 images · non-contrast
Comparison: 09/30/2016 high-resolution chest CT.

CLINICAL DATA: Follow-up interstitial lung disease.

EXAM:
CT CHEST WITHOUT CONTRAST
TECHNIQUE: Multidetector CT imaging of the chest was performed following the
standard protocol without intravenous contrast. High resolution
imaging of the lungs, as well as inspiratory and expiratory imaging,
was performed.

[Series 2: high resolution · axial · 0.69mm/px · z∈[-318,-32]mm · 12 of 159 slices shown, 15 images]
[im 8/159  mediastinal]
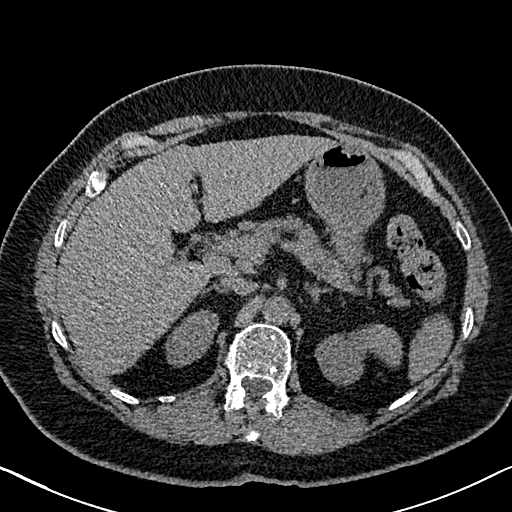
[im 8/159  lung]
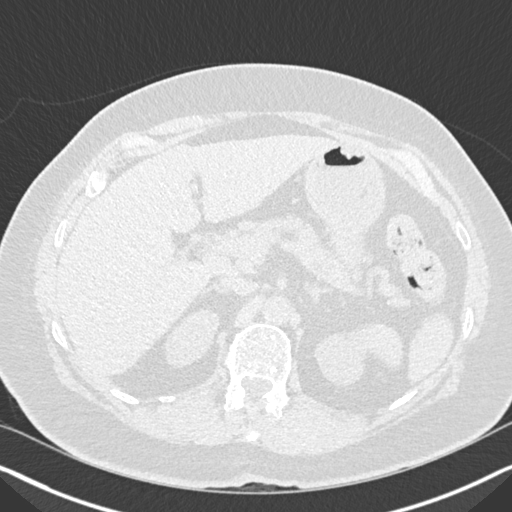
[im 22/159  lung]
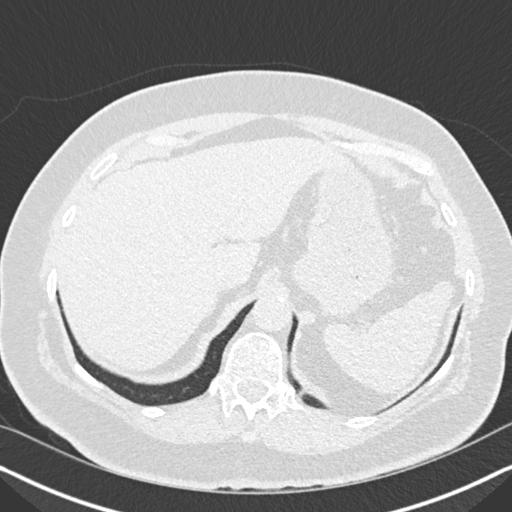
[im 36/159  lung]
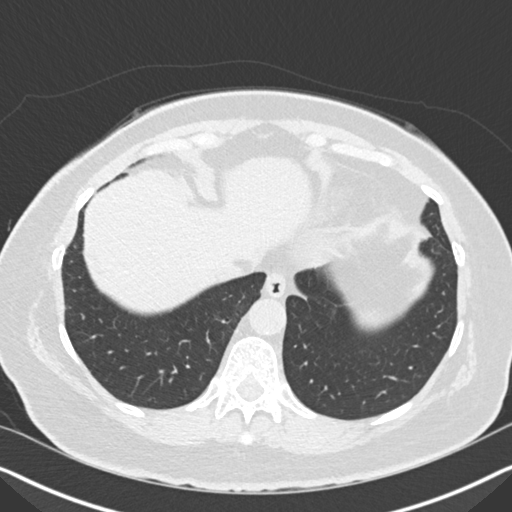
[im 51/159  lung]
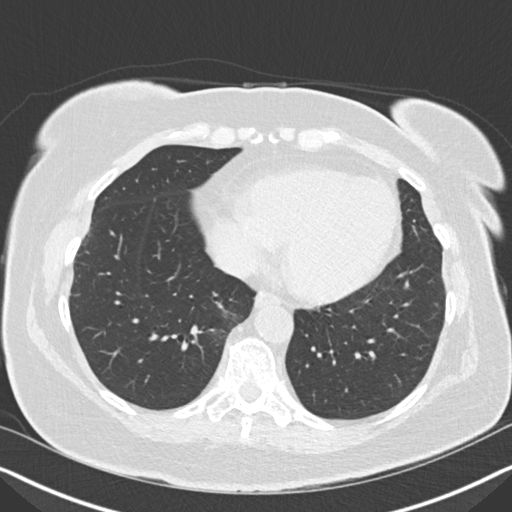
[im 58/159  mediastinal]
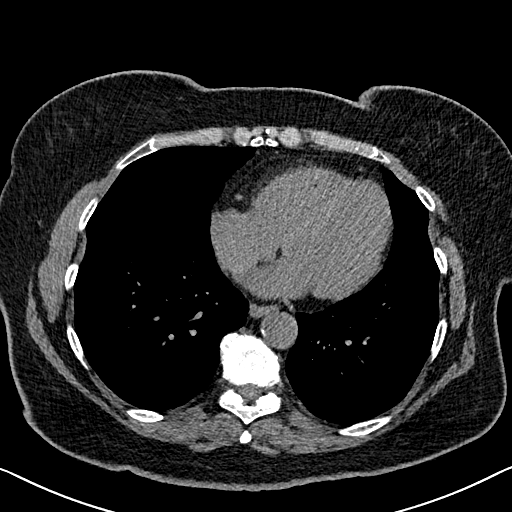
[im 58/159  lung]
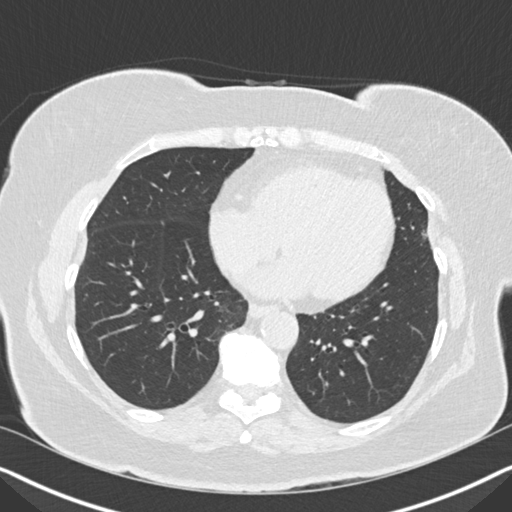
[im 72/159  lung]
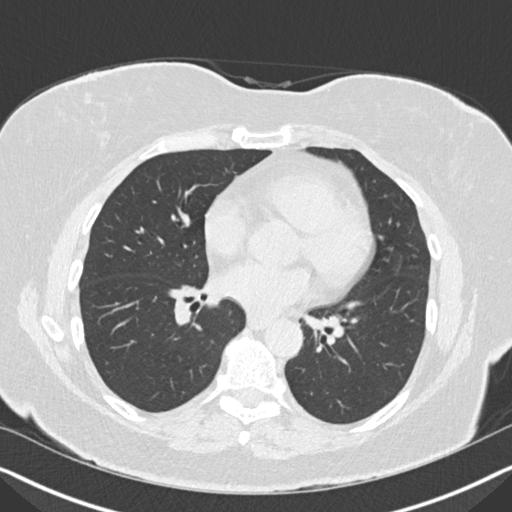
[im 87/159  lung]
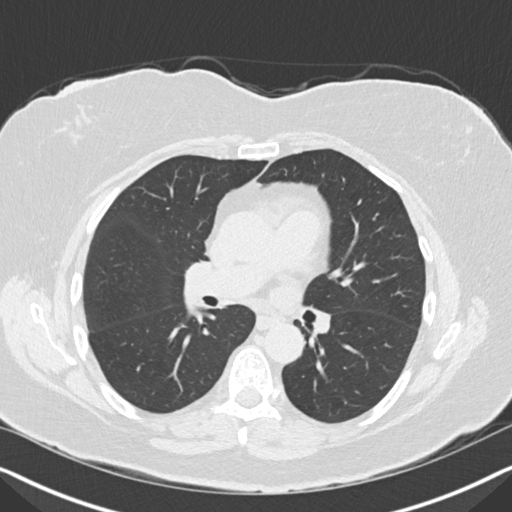
[im 101/159  lung]
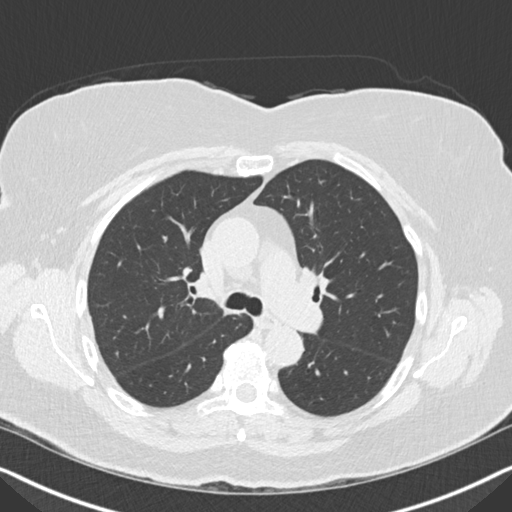
[im 108/159  mediastinal]
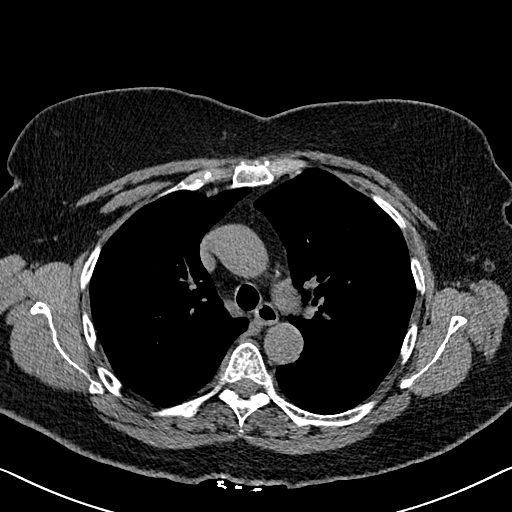
[im 108/159  lung]
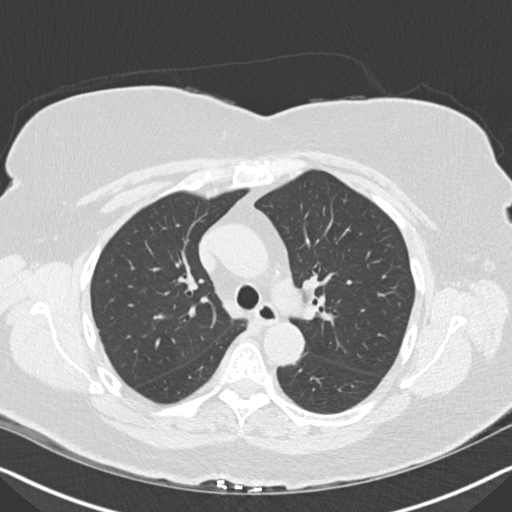
[im 123/159  lung]
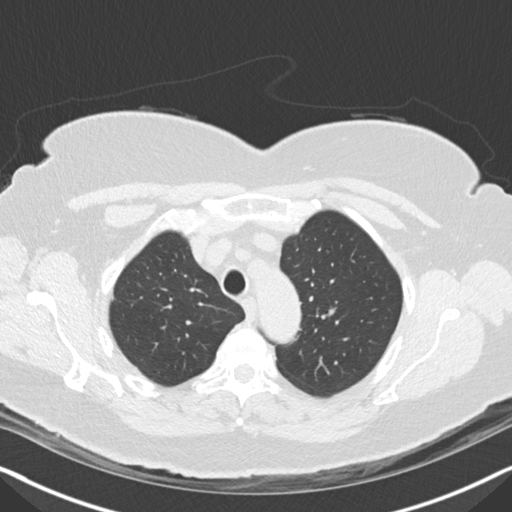
[im 137/159  lung]
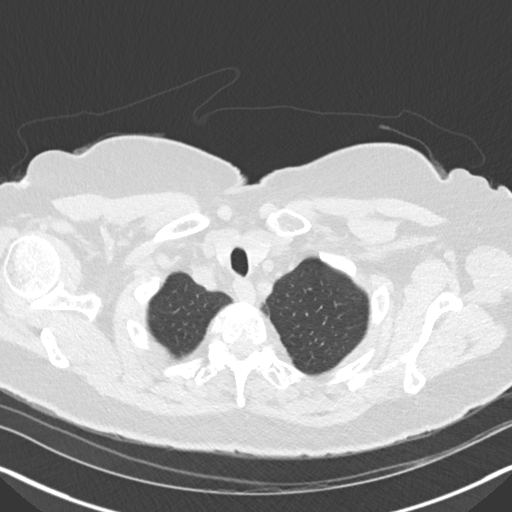
[im 151/159  lung]
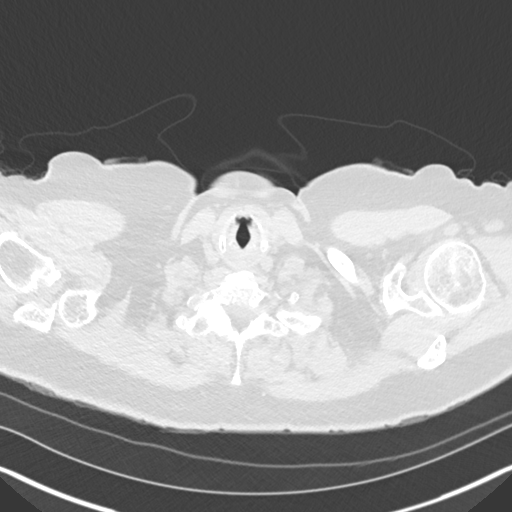

[Series 8: coronal · coronal · 0.62mm/px · 3 of 118 slices shown]
[im 24/118  lung]
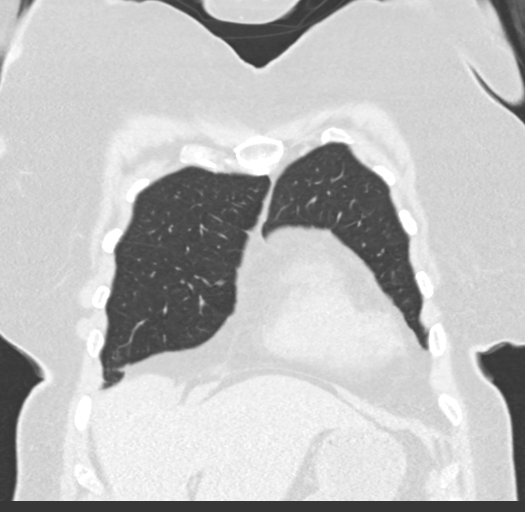
[im 47/118  lung]
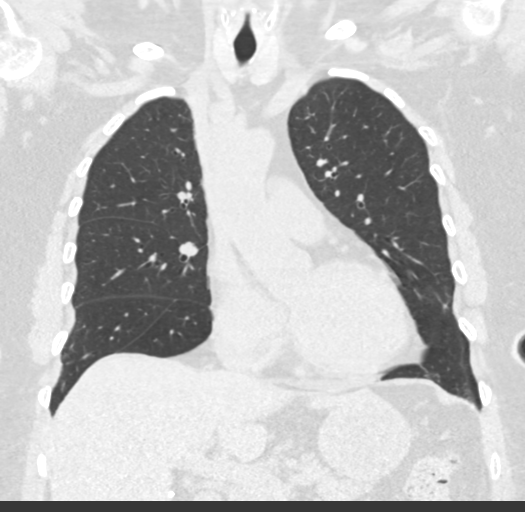
[im 71/118  lung]
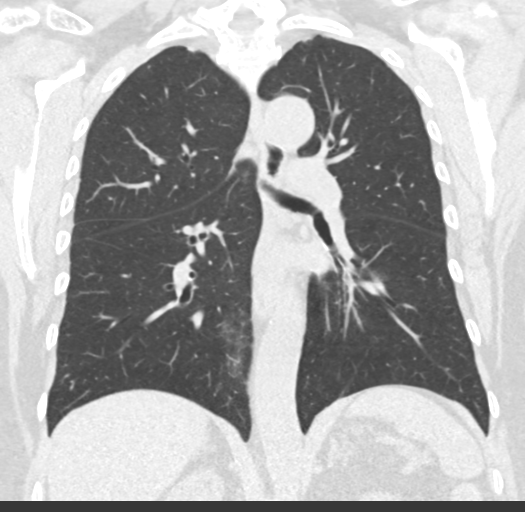

[15 of 36 positions shown; findings below may reference images not displayed]

FINDINGS: Cardiovascular: Normal heart size. No significant pericardial
fluid/thickening. Mildly atherosclerotic nonaneurysmal thoracic
aorta. Normal caliber pulmonary arteries.

Mediastinum/Nodes: No discrete thyroid nodules. Unremarkable
esophagus. No pathologically enlarged axillary, mediastinal or gross
hilar lymph nodes, noting limited sensitivity for the detection of
hilar adenopathy on this noncontrast study.

Lungs/Pleura: No pneumothorax. No pleural effusion. No acute
consolidative airspace disease, lung masses or significant pulmonary
nodules. Moderate patchy air trapping in both lungs on the
expiration sequence, unchanged. Minimal scattered subpleural
reticulation and ground-glass attenuation in the mid lungs
bilaterally appears slightly less prominent compared to the
09/30/2016 scan. No significant regions of traction bronchiectasis,
architectural distortion, parenchymal banding or frank honeycombing.

Upper abdomen: Cholecystectomy.

Musculoskeletal: No aggressive appearing focal osseous lesions.
Small sclerotic lesion in the inferior posterior T7 vertebral body
is not convincingly changed, most compatible with a benign bone
island. Mild thoracic spondylosis.
IMPRESSION: 1. No convincing findings of interstitial lung disease at this time.
Minimal scattered subpleural reticulation and ground-glass
attenuation in the mid lungs, appearing less prominent compared to
09/30/2016 scan, more suggestive of a combination of hypoventilatory
change and minimal nonspecific postinflammatory scarring.
2. Moderate air trapping, indicative of small airways disease,
unchanged.

Aortic Atherosclerosis ([id number]-N07.7).

## 2018-07-20 ENCOUNTER — Other Ambulatory Visit: Payer: Self-pay | Admitting: Internal Medicine
